# Patient Record
Sex: Female | Born: 1995 | Race: Black or African American | Hispanic: No | Marital: Single | Smoking: Never smoker
Health system: Southern US, Community
[De-identification: ages and names within clinical notes are randomized; demographics above are authoritative.]

## PROBLEM LIST (undated history)

## (undated) DIAGNOSIS — I7300 Raynaud's syndrome without gangrene: Secondary | ICD-10-CM

## (undated) DIAGNOSIS — F419 Anxiety disorder, unspecified: Secondary | ICD-10-CM

---
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
## 2014-11-12 ENCOUNTER — Emergency Department
Admit: 2014-11-12 | Disposition: A | Discharge status: Home / Self Care | Payer: Self-pay | Admitting: Emergency Medicine

## 2014-11-13 LAB — CBC WITH DIFFERENTIAL/PLATELET
BASOS PCT: 1.1 %
Basophil #: 0 10*3/uL (ref 0.0–0.1)
EOS ABS: 0 10*3/uL (ref 0.0–0.7)
Eosinophil %: 1.2 %
HCT: 46.5 % (ref 35.0–47.0)
HGB: 15.1 g/dL (ref 12.0–16.0)
LYMPHS PCT: 29.5 %
Lymphocyte #: 0.9 10*3/uL — ABNORMAL LOW (ref 1.0–3.6)
MCH: 28.8 pg (ref 26.0–34.0)
MCHC: 32.5 g/dL (ref 32.0–36.0)
MCV: 89 fL (ref 80–100)
Monocyte #: 0.5 x10 3/mm (ref 0.2–0.9)
Monocyte %: 15.4 %
NEUTROS PCT: 52.8 %
Neutrophil #: 1.6 10*3/uL (ref 1.4–6.5)
PLATELETS: 213 10*3/uL (ref 150–440)
RBC: 5.23 10*6/uL — ABNORMAL HIGH (ref 3.80–5.20)
RDW: 13.8 % (ref 11.5–14.5)
WBC: 3 10*3/uL — ABNORMAL LOW (ref 3.6–11.0)

## 2014-11-13 LAB — COMPREHENSIVE METABOLIC PANEL
ALT: 20 U/L
ANION GAP: 8 (ref 7–16)
Albumin: 4.1 g/dL
Alkaline Phosphatase: 61 U/L
BUN: 6 mg/dL
Bilirubin,Total: <0.1 mg/dL — ABNORMAL LOW
CHLORIDE: 103 mmol/L
CREATININE: 0.8 mg/dL
Calcium, Total: 9.1 mg/dL
Co2: 28 mmol/L
EGFR (African American): >60
EGFR (Non-African Amer.): >60
GLUCOSE: 101 mg/dL — AB
POTASSIUM: 3.5 mmol/L
SGOT(AST): 30 U/L
SODIUM: 139 mmol/L
TOTAL PROTEIN: 7.6 g/dL

## 2014-11-13 LAB — MONONUCLEOSIS SCREEN: MONO TEST: NEGATIVE

## 2014-11-15 LAB — BETA STREP CULTURE(ARMC)

## 2018-03-20 DIAGNOSIS — Z124 Encounter for screening for malignant neoplasm of cervix: Secondary | ICD-10-CM | POA: Diagnosis not present

## 2019-02-01 ENCOUNTER — Emergency Department (HOSPITAL_BASED_OUTPATIENT_CLINIC_OR_DEPARTMENT_OTHER)
Admission: EM | Admit: 2019-02-01 | Discharge: 2019-02-01 | Disposition: A | Discharge status: Home / Self Care | Payer: Managed Care, Other (non HMO) | Attending: Emergency Medicine | Admitting: Emergency Medicine

## 2019-02-01 ENCOUNTER — Encounter (HOSPITAL_BASED_OUTPATIENT_CLINIC_OR_DEPARTMENT_OTHER): Payer: Self-pay | Admitting: *Deleted

## 2019-02-01 ENCOUNTER — Emergency Department (HOSPITAL_BASED_OUTPATIENT_CLINIC_OR_DEPARTMENT_OTHER): Payer: Managed Care, Other (non HMO)

## 2019-02-01 ENCOUNTER — Other Ambulatory Visit: Payer: Self-pay

## 2019-02-01 DIAGNOSIS — R0789 Other chest pain: Secondary | ICD-10-CM | POA: Diagnosis present

## 2019-02-01 DIAGNOSIS — K219 Gastro-esophageal reflux disease without esophagitis: Secondary | ICD-10-CM | POA: Insufficient documentation

## 2019-02-01 DIAGNOSIS — R112 Nausea with vomiting, unspecified: Secondary | ICD-10-CM

## 2019-02-01 DIAGNOSIS — R079 Chest pain, unspecified: Secondary | ICD-10-CM

## 2019-02-01 HISTORY — DX: Raynaud's syndrome without gangrene: I73.00

## 2019-02-01 HISTORY — DX: Anxiety disorder, unspecified: F41.9

## 2019-02-01 LAB — CBC WITH DIFFERENTIAL/PLATELET
Abs Immature Granulocytes: 0 10*3/uL (ref 0.00–0.07)
Basophils Absolute: 0 10*3/uL (ref 0.0–0.1)
Basophils Relative: 1 %
Eosinophils Absolute: 0 10*3/uL (ref 0.0–0.5)
Eosinophils Relative: 1 %
HCT: 44.5 % (ref 36.0–46.0)
Hemoglobin: 14.8 g/dL (ref 12.0–15.0)
Immature Granulocytes: 0 %
Lymphocytes Relative: 31 %
Lymphs Abs: 1.1 10*3/uL (ref 0.7–4.0)
MCH: 29.6 pg (ref 26.0–34.0)
MCHC: 33.3 g/dL (ref 30.0–36.0)
MCV: 89 fL (ref 80.0–100.0)
Monocytes Absolute: 0.4 10*3/uL (ref 0.1–1.0)
Monocytes Relative: 12 %
Neutro Abs: 2 10*3/uL (ref 1.7–7.7)
Neutrophils Relative %: 55 %
Platelets: 277 10*3/uL (ref 150–400)
RBC: 5 MIL/uL (ref 3.87–5.11)
RDW: 12.3 % (ref 11.5–15.5)
WBC: 3.6 10*3/uL — ABNORMAL LOW (ref 4.0–10.5)
nRBC: 0 % (ref 0.0–0.2)

## 2019-02-01 LAB — COMPREHENSIVE METABOLIC PANEL
ALT: 14 U/L (ref 0–44)
AST: 19 U/L (ref 15–41)
Albumin: 4.6 g/dL (ref 3.5–5.0)
Alkaline Phosphatase: 49 U/L (ref 38–126)
Anion gap: 12 (ref 5–15)
BUN: 8 mg/dL (ref 6–20)
CO2: 24 mmol/L (ref 22–32)
Calcium: 9.1 mg/dL (ref 8.9–10.3)
Chloride: 103 mmol/L (ref 98–111)
Creatinine, Ser: 0.88 mg/dL (ref 0.44–1.00)
GFR calc Af Amer: >60 mL/min (ref >60)
GFR calc non Af Amer: >60 mL/min (ref >60)
Glucose, Bld: 77 mg/dL (ref 70–99)
Potassium: 3.4 mmol/L — ABNORMAL LOW (ref 3.5–5.1)
Sodium: 139 mmol/L (ref 135–145)
Total Bilirubin: 0.5 mg/dL (ref 0.3–1.2)
Total Protein: 7.7 g/dL (ref 6.5–8.1)

## 2019-02-01 LAB — TROPONIN I (HIGH SENSITIVITY): Troponin I (High Sensitivity): <2 ng/L (ref <18)

## 2019-02-01 LAB — PREGNANCY, URINE: Preg Test, Ur: NEGATIVE

## 2019-02-01 LAB — LIPASE, BLOOD: Lipase: 36 U/L (ref 11–51)

## 2019-02-01 MED ORDER — FAMOTIDINE 20 MG PO TABS: 20.0000 mg | ORAL_TABLET | Freq: Two times a day (BID) | ORAL | 0 refills | Status: AC

## 2019-02-01 MED ORDER — POTASSIUM CHLORIDE CRYS ER 20 MEQ PO TBCR
20.0000 meq | EXTENDED_RELEASE_TABLET | Freq: Once | ORAL | Status: AC
  Administered 2019-02-01: 20 meq via ORAL
  Filled 2019-02-01: qty 1

## 2019-02-01 MED ORDER — SODIUM CHLORIDE 0.9 % IV BOLUS
1000.0000 mL | Freq: Once | INTRAVENOUS | Status: AC
  Administered 2019-02-01: 1000 mL via INTRAVENOUS

## 2019-02-01 NOTE — ED Notes (Signed)
ED Provider at bedside. 

## 2019-02-01 NOTE — ED Provider Notes (Signed)
[facility] EMERGENCY DEPARTMENT Provider Note   CSN: [id number] Arrival date & time: 02/01/19  1843    History   Chief Complaint No chief complaint on file.   HPI Kelsey Davidson is a 23 y.o. female.     HPI   23 year old female presents with concern for twinge in her chest, bilateral numbness in her hands after being started on omeprazole after being seen at an urgent care for abdominal pain, nausea and vomiting, being diagnosed with GERD.  Patient reports that she has had abdominal symptoms for the last few months.  Reports that over the last week, her symptoms are worse and she presented to urgent care.  Reports she has had episodes of vomiting daily, feeling of nausea, as well as sensation that she has something in the back of her throat.  Describes it as a "paper feeling" reports it feels like something is there when she swallows.  She does report some abdominal pain after eating.  Also reports that every time she eats she feels she has to go to the bathroom.  Reports on Wednesday, she began to feel like a pressure under her left collarbone, as well as pressure in her hands. The chest pain feels like a twinge.  Denies shortness of breath.  She does take birth control, and recently drove from [place], denies any leg pain or swelling or dyspnea.  Denies family history of DVT or PE, early heart disease.  Denies history of smoking cigarettes or using drugs.  Past Medical History:  Diagnosis Date  . Anxiety   . Raynaud's disease     There are no active problems to display for this patient.   History reviewed. No pertinent surgical history.   OB History   No obstetric history on file.      Home Medications    Prior to Admission medications   Medication Sig Start Date End Date Taking? Authorizing Provider  OMEPRAZOLE PO Take by mouth.  02/01/19 Yes [provider]  famotidine (PEPCID) 20 MG tablet Take 1 tablet (20 mg total) by mouth 2 (two) times  daily. 02/01/19   Gareth Morgan, MD    Family History No family history on file.  Social History Social History   Tobacco Use  . Smoking status: Never Smoker  . Smokeless tobacco: Never Used  Substance Use Topics  . Alcohol use: Never    Frequency: Never  . Drug use: Never     Allergies   Patient has no known allergies.   Review of Systems Review of Systems  Constitutional: Negative for fever.  HENT: Negative for sore throat. Trouble swallowing: sensation of something i n her throat.   Eyes: Negative for visual disturbance.  Respiratory: Negative for cough and shortness of breath.   Cardiovascular: Positive for chest pain.  Gastrointestinal: Positive for abdominal pain, nausea and vomiting. Diarrhea: denies but feels need to go after eating.  Genitourinary: Negative for difficulty urinating and dysuria.  Musculoskeletal: Negative for back pain and neck pain.  Skin: Negative for rash.  Neurological: Negative for syncope and headaches.     Physical Exam Updated Vital Signs BP 118/78 (BP Location: Left Arm)   Pulse 70   Temp 98.7 F (37.1 C) (Oral)   Resp 16   Ht 5\' 3"  (1.6 m)   Wt 47.3 kg   SpO2 100%   BMI 18.48 kg/m   Physical Exam Vitals signs and nursing note reviewed.  Constitutional:      General: She  is not in acute distress.    Appearance: She is well-developed. She is not diaphoretic.  HENT:     Head: Normocephalic and atraumatic.  Eyes:     Conjunctiva/sclera: Conjunctivae normal.  Neck:     Musculoskeletal: Normal range of motion.  Cardiovascular:     Rate and Rhythm: Normal rate and regular rhythm.     Heart sounds: Normal heart sounds. No murmur. No friction rub. No gallop.   Pulmonary:     Effort: Pulmonary effort is normal. No respiratory distress.     Breath sounds: Normal breath sounds. No wheezing or rales.  Abdominal:     General: There is no distension.     Palpations: Abdomen is soft.     Tenderness: There is abdominal  tenderness (epigastrium). There is no guarding.  Musculoskeletal:        General: No tenderness.  Skin:    General: Skin is warm and dry.     Findings: No erythema or rash.  Neurological:     Mental Status: She is alert and oriented to person, place, and time.      ED Treatments / Results  Labs (all labs ordered are listed, but only abnormal results are displayed) Labs Reviewed  CBC WITH DIFFERENTIAL/PLATELET - Abnormal; Notable for the following components:      Result Value   WBC 3.6 (*)    All other components within normal limits  COMPREHENSIVE METABOLIC PANEL - Abnormal; Notable for the following components:   Potassium 3.4 (*)    All other components within normal limits  LIPASE, BLOOD  TROPONIN I (HIGH SENSITIVITY)  PREGNANCY, URINE  TROPONIN I (HIGH SENSITIVITY)    EKG EKG Interpretation  Date/Time:  Friday February 01 2019 19:01:34 EDT Ventricular Rate:  112 PR Interval:    QRS Duration: 84 QT Interval:  326 QTC Calculation: 445 R Axis:   41 Text Interpretation:  Sinus tachycardia No previous ECGs available Confirmed by Alvira MondaySchlossman, Delonte Musich ([id number]) on 02/01/2019 7:58:26 PM   Radiology Dg Chest 2 View  Result Date: 02/01/2019 CLINICAL DATA:  23 year old female with chest pain. EXAM: CHEST - 2 VIEW COMPARISON:  None. FINDINGS: The heart size and mediastinal contours are within normal limits. Both lungs are clear. The visualized skeletal structures are unremarkable. IMPRESSION: No active cardiopulmonary disease. Electronically Signed   By: Elgie CollardArash  Radparvar M.D.   On: 02/01/2019 20:01    Procedures Procedures (including critical care time)  Medications Ordered in ED Medications  sodium chloride 0.9 % bolus 1,000 mL (0 mLs Intravenous Stopped 02/01/19 2103)  potassium chloride SA (K-DUR) CR tablet 20 mEq (20 mEq Oral Given 02/01/19 2145)     Initial Impression / Assessment and Plan / ED Course  I have reviewed the triage vital signs and the nursing notes.   Pertinent labs & imaging results that were available during my care of the patient were reviewed by me and considered in my medical decision making (see chart for details).         23 year old female presents with concern for twinge in her chest, bilateral numbness in her hands after being started on omeprazole after being seen at an urgent care for abdominal pain, nausea and vomiting, being diagnosed with GERD.  Patient with tachycardia on arrival, but this is in the setting of feeling anxious, and dehydration in setting of not eating or drinking as much.  On recheck, patient's heart rate improved.  Describing chest symptoms after starting omeprazole.  EKG was completed and evaluate  me and shows no acute abnormalities.  Chest x-ray shows no sign of pneumonia, pneumothorax, or other abnormalities.  Regarding her chest pain, troponin negative, EKG without acute abnormalities.  Have low suspicion for pulmonary embolus given normal oxygenation, no asymmetric leg swelling, no shortness of breath.  Pregnancy test is negative.  Regarding her abdominal pain, there is no sign of pancreatitis, hepatitis, low suspicion for cholecystitis by history and physical exam.  Recommend continued outpatient follow-up for her abdominal pain, but agree that gastroesophageal reflux as diagnosed at the urgent care is possible.  Given her symptoms developed after starting omeprazole, recommend discontinuing this medication, will give prescription for famotidine.  Patient is scheduled to see a primary care physician for the first time.  Encouraged this follow-up, and discussed that if her symptoms continue, she may also require gastroenterology follow-up. Patient discharged in stable condition with understanding of reasons to return.   Final Clinical Impressions(s) / ED Diagnoses   Final diagnoses:  Chest pain, unspecified type  Nausea and vomiting, intractability of vomiting not specified, unspecified vomiting type   Gastroesophageal reflux disease, esophagitis presence not specified    ED Discharge Orders         Ordered    famotidine (PEPCID) 20 MG tablet  2 times daily     02/01/19 2133           Alvira MondaySchlossman, Murlene Revell, MD 02/02/19 0040

## 2019-02-01 NOTE — ED Notes (Signed)
Patient transported to X-ray 

## 2019-02-01 NOTE — ED Triage Notes (Signed)
Abdominal issues for a while. Nausea. She was seen at [facility] and diagnosed with GERD. She was told to take Omeprazole. Now she has numbness in her hands and "twinging" in her chest.

## 2019-02-01 NOTE — ED Notes (Signed)
Pt. Reports she has been vomiting a lot lately and had a paper feeling in her mouth and throat.  Pt. Said she was put on Omeprazole for GERD but now she feels she ahd had some reaction to.  Pt. Said she feels like she has pressure under her collar bone and pressure in her hands.   Pt. Is extemely nervous shaking with heart rate up.

## 2019-02-07 ENCOUNTER — Other Ambulatory Visit (HOSPITAL_COMMUNITY): Payer: Self-pay | Admitting: Gastroenterology

## 2019-02-07 DIAGNOSIS — R1011 Right upper quadrant pain: Secondary | ICD-10-CM

## 2019-02-13 ENCOUNTER — Other Ambulatory Visit: Payer: Self-pay | Admitting: Gastroenterology

## 2019-02-13 DIAGNOSIS — R1310 Dysphagia, unspecified: Secondary | ICD-10-CM

## 2019-02-15 ENCOUNTER — Ambulatory Visit
Admission: RE | Admit: 2019-02-15 | Discharge: 2019-02-15 | Disposition: A | Discharge status: Home / Self Care | Payer: Managed Care, Other (non HMO) | Source: Ambulatory Visit | Attending: Gastroenterology | Admitting: Gastroenterology

## 2019-02-15 DIAGNOSIS — R1310 Dysphagia, unspecified: Secondary | ICD-10-CM

## 2019-02-19 ENCOUNTER — Encounter (HOSPITAL_COMMUNITY)
Admission: RE | Admit: 2019-02-19 | Discharge: 2019-02-19 | Disposition: A | Discharge status: Home / Self Care | Payer: Managed Care, Other (non HMO) | Source: Ambulatory Visit | Attending: Gastroenterology | Admitting: Gastroenterology

## 2019-02-19 ENCOUNTER — Ambulatory Visit (HOSPITAL_COMMUNITY)
Admission: RE | Admit: 2019-02-19 | Discharge: 2019-02-19 | Disposition: A | Discharge status: Home / Self Care | Payer: Managed Care, Other (non HMO) | Source: Ambulatory Visit | Attending: Gastroenterology | Admitting: Gastroenterology

## 2019-02-19 DIAGNOSIS — R1011 Right upper quadrant pain: Secondary | ICD-10-CM | POA: Diagnosis present

## 2019-02-19 MED ORDER — TECHNETIUM TC 99M MEBROFENIN IV KIT
5.2000 | PACK | Freq: Once | INTRAVENOUS | Status: AC | PRN
  Administered 2019-02-19: 5.2 via INTRAVENOUS

## 2019-05-16 ENCOUNTER — Ambulatory Visit (INDEPENDENT_AMBULATORY_CARE_PROVIDER_SITE_OTHER): Payer: 59 | Admitting: Psychology

## 2019-05-16 DIAGNOSIS — F410 Panic disorder [episodic paroxysmal anxiety] without agoraphobia: Secondary | ICD-10-CM | POA: Diagnosis not present

## 2019-05-30 ENCOUNTER — Ambulatory Visit (INDEPENDENT_AMBULATORY_CARE_PROVIDER_SITE_OTHER): Payer: 59 | Admitting: Psychology

## 2019-05-30 DIAGNOSIS — F410 Panic disorder [episodic paroxysmal anxiety] without agoraphobia: Secondary | ICD-10-CM

## 2019-06-12 ENCOUNTER — Other Ambulatory Visit: Payer: Self-pay | Admitting: Otolaryngology

## 2019-06-12 DIAGNOSIS — R070 Pain in throat: Secondary | ICD-10-CM

## 2019-06-18 ENCOUNTER — Ambulatory Visit (INDEPENDENT_AMBULATORY_CARE_PROVIDER_SITE_OTHER): Payer: 59 | Admitting: Psychology

## 2019-06-18 DIAGNOSIS — F410 Panic disorder [episodic paroxysmal anxiety] without agoraphobia: Secondary | ICD-10-CM | POA: Diagnosis not present

## 2019-06-19 ENCOUNTER — Ambulatory Visit
Admission: RE | Admit: 2019-06-19 | Discharge: 2019-06-19 | Disposition: A | Discharge status: Home / Self Care | Payer: Managed Care, Other (non HMO) | Source: Ambulatory Visit | Attending: Otolaryngology | Admitting: Otolaryngology

## 2019-06-19 ENCOUNTER — Other Ambulatory Visit: Payer: Self-pay

## 2019-06-19 DIAGNOSIS — R070 Pain in throat: Secondary | ICD-10-CM

## 2019-06-19 MED ORDER — IOPAMIDOL (ISOVUE-300) INJECTION 61%
75.0000 mL | Freq: Once | INTRAVENOUS | Status: AC | PRN
  Administered 2019-06-19: 75 mL via INTRAVENOUS

## 2019-07-02 ENCOUNTER — Ambulatory Visit (INDEPENDENT_AMBULATORY_CARE_PROVIDER_SITE_OTHER): Payer: 59 | Admitting: Psychology

## 2019-07-02 DIAGNOSIS — F410 Panic disorder [episodic paroxysmal anxiety] without agoraphobia: Secondary | ICD-10-CM | POA: Diagnosis not present

## 2019-07-17 ENCOUNTER — Ambulatory Visit: Payer: 59 | Admitting: Psychology

## 2019-08-21 DIAGNOSIS — M542 Cervicalgia: Secondary | ICD-10-CM | POA: Diagnosis not present

## 2019-09-02 ENCOUNTER — Ambulatory Visit (INDEPENDENT_AMBULATORY_CARE_PROVIDER_SITE_OTHER): Payer: BC Managed Care – PPO | Admitting: Psychology

## 2019-09-02 DIAGNOSIS — F410 Panic disorder [episodic paroxysmal anxiety] without agoraphobia: Secondary | ICD-10-CM | POA: Diagnosis not present

## 2019-09-16 ENCOUNTER — Ambulatory Visit (INDEPENDENT_AMBULATORY_CARE_PROVIDER_SITE_OTHER): Payer: BC Managed Care – PPO | Admitting: Psychology

## 2019-09-16 DIAGNOSIS — F410 Panic disorder [episodic paroxysmal anxiety] without agoraphobia: Secondary | ICD-10-CM

## 2019-09-18 DIAGNOSIS — M542 Cervicalgia: Secondary | ICD-10-CM | POA: Diagnosis not present

## 2019-09-30 ENCOUNTER — Ambulatory Visit: Payer: BC Managed Care – PPO | Admitting: Psychology

## 2019-10-18 DIAGNOSIS — M542 Cervicalgia: Secondary | ICD-10-CM | POA: Diagnosis not present

## 2019-10-22 ENCOUNTER — Ambulatory Visit (INDEPENDENT_AMBULATORY_CARE_PROVIDER_SITE_OTHER): Payer: BC Managed Care – PPO | Admitting: Psychology

## 2019-10-22 DIAGNOSIS — F410 Panic disorder [episodic paroxysmal anxiety] without agoraphobia: Secondary | ICD-10-CM | POA: Diagnosis not present

## 2019-10-23 DIAGNOSIS — Z79899 Other long term (current) drug therapy: Secondary | ICD-10-CM | POA: Diagnosis not present

## 2019-10-23 DIAGNOSIS — R7989 Other specified abnormal findings of blood chemistry: Secondary | ICD-10-CM | POA: Diagnosis not present

## 2019-10-23 DIAGNOSIS — Z0000 Encounter for general adult medical examination without abnormal findings: Secondary | ICD-10-CM | POA: Diagnosis not present

## 2019-10-29 DIAGNOSIS — Z0000 Encounter for general adult medical examination without abnormal findings: Secondary | ICD-10-CM | POA: Diagnosis not present

## 2019-10-29 DIAGNOSIS — Z1331 Encounter for screening for depression: Secondary | ICD-10-CM | POA: Diagnosis not present

## 2019-10-29 DIAGNOSIS — J309 Allergic rhinitis, unspecified: Secondary | ICD-10-CM | POA: Diagnosis not present

## 2019-10-29 DIAGNOSIS — R82998 Other abnormal findings in urine: Secondary | ICD-10-CM | POA: Diagnosis not present

## 2019-10-29 DIAGNOSIS — M62838 Other muscle spasm: Secondary | ICD-10-CM | POA: Diagnosis not present

## 2019-10-29 DIAGNOSIS — K589 Irritable bowel syndrome without diarrhea: Secondary | ICD-10-CM | POA: Diagnosis not present

## 2019-10-29 DIAGNOSIS — F39 Unspecified mood [affective] disorder: Secondary | ICD-10-CM | POA: Diagnosis not present

## 2019-10-31 DIAGNOSIS — M542 Cervicalgia: Secondary | ICD-10-CM | POA: Diagnosis not present

## 2019-11-04 ENCOUNTER — Ambulatory Visit (INDEPENDENT_AMBULATORY_CARE_PROVIDER_SITE_OTHER): Payer: BC Managed Care – PPO | Admitting: Psychology

## 2019-11-04 DIAGNOSIS — F410 Panic disorder [episodic paroxysmal anxiety] without agoraphobia: Secondary | ICD-10-CM | POA: Diagnosis not present

## 2019-11-13 DIAGNOSIS — M542 Cervicalgia: Secondary | ICD-10-CM | POA: Diagnosis not present

## 2019-11-19 ENCOUNTER — Ambulatory Visit (INDEPENDENT_AMBULATORY_CARE_PROVIDER_SITE_OTHER): Payer: BC Managed Care – PPO | Admitting: Psychology

## 2019-11-19 DIAGNOSIS — F410 Panic disorder [episodic paroxysmal anxiety] without agoraphobia: Secondary | ICD-10-CM

## 2019-11-27 DIAGNOSIS — M542 Cervicalgia: Secondary | ICD-10-CM | POA: Diagnosis not present

## 2019-12-02 ENCOUNTER — Ambulatory Visit (INDEPENDENT_AMBULATORY_CARE_PROVIDER_SITE_OTHER): Payer: BC Managed Care – PPO | Admitting: Psychology

## 2019-12-02 DIAGNOSIS — F410 Panic disorder [episodic paroxysmal anxiety] without agoraphobia: Secondary | ICD-10-CM | POA: Diagnosis not present

## 2019-12-11 DIAGNOSIS — M542 Cervicalgia: Secondary | ICD-10-CM | POA: Diagnosis not present

## 2019-12-23 ENCOUNTER — Ambulatory Visit (INDEPENDENT_AMBULATORY_CARE_PROVIDER_SITE_OTHER): Payer: BC Managed Care – PPO | Admitting: Psychology

## 2019-12-23 DIAGNOSIS — F410 Panic disorder [episodic paroxysmal anxiety] without agoraphobia: Secondary | ICD-10-CM | POA: Diagnosis not present

## 2020-01-17 ENCOUNTER — Ambulatory Visit (INDEPENDENT_AMBULATORY_CARE_PROVIDER_SITE_OTHER): Payer: BC Managed Care – PPO | Admitting: Psychology

## 2020-01-17 DIAGNOSIS — F410 Panic disorder [episodic paroxysmal anxiety] without agoraphobia: Secondary | ICD-10-CM

## 2020-02-03 ENCOUNTER — Ambulatory Visit: Payer: BC Managed Care – PPO | Admitting: Psychology

## 2020-02-11 DIAGNOSIS — M542 Cervicalgia: Secondary | ICD-10-CM | POA: Diagnosis not present

## 2020-02-19 ENCOUNTER — Ambulatory Visit (INDEPENDENT_AMBULATORY_CARE_PROVIDER_SITE_OTHER): Payer: BC Managed Care – PPO | Admitting: Psychology

## 2020-02-19 DIAGNOSIS — M542 Cervicalgia: Secondary | ICD-10-CM | POA: Diagnosis not present

## 2020-02-19 DIAGNOSIS — F410 Panic disorder [episodic paroxysmal anxiety] without agoraphobia: Secondary | ICD-10-CM

## 2020-03-03 ENCOUNTER — Ambulatory Visit (INDEPENDENT_AMBULATORY_CARE_PROVIDER_SITE_OTHER): Payer: BC Managed Care – PPO | Admitting: Psychology

## 2020-03-03 DIAGNOSIS — F410 Panic disorder [episodic paroxysmal anxiety] without agoraphobia: Secondary | ICD-10-CM

## 2020-03-10 DIAGNOSIS — M62838 Other muscle spasm: Secondary | ICD-10-CM | POA: Diagnosis not present

## 2020-03-10 DIAGNOSIS — M542 Cervicalgia: Secondary | ICD-10-CM | POA: Diagnosis not present

## 2020-03-12 DIAGNOSIS — M62830 Muscle spasm of back: Secondary | ICD-10-CM | POA: Diagnosis not present

## 2020-03-17 DIAGNOSIS — M9902 Segmental and somatic dysfunction of thoracic region: Secondary | ICD-10-CM | POA: Diagnosis not present

## 2020-03-17 DIAGNOSIS — M5382 Other specified dorsopathies, cervical region: Secondary | ICD-10-CM | POA: Diagnosis not present

## 2020-03-17 DIAGNOSIS — M9901 Segmental and somatic dysfunction of cervical region: Secondary | ICD-10-CM | POA: Diagnosis not present

## 2020-03-17 DIAGNOSIS — M5384 Other specified dorsopathies, thoracic region: Secondary | ICD-10-CM | POA: Diagnosis not present

## 2020-03-19 ENCOUNTER — Ambulatory Visit: Payer: BC Managed Care – PPO | Admitting: Psychology

## 2020-03-19 DIAGNOSIS — M9901 Segmental and somatic dysfunction of cervical region: Secondary | ICD-10-CM | POA: Diagnosis not present

## 2020-03-19 DIAGNOSIS — M5382 Other specified dorsopathies, cervical region: Secondary | ICD-10-CM | POA: Diagnosis not present

## 2020-03-19 DIAGNOSIS — M9902 Segmental and somatic dysfunction of thoracic region: Secondary | ICD-10-CM | POA: Diagnosis not present

## 2020-03-19 DIAGNOSIS — M5384 Other specified dorsopathies, thoracic region: Secondary | ICD-10-CM | POA: Diagnosis not present

## 2020-03-20 ENCOUNTER — Ambulatory Visit (INDEPENDENT_AMBULATORY_CARE_PROVIDER_SITE_OTHER): Payer: BC Managed Care – PPO | Admitting: Psychology

## 2020-03-20 DIAGNOSIS — F410 Panic disorder [episodic paroxysmal anxiety] without agoraphobia: Secondary | ICD-10-CM

## 2020-03-20 DIAGNOSIS — Z03818 Encounter for observation for suspected exposure to other biological agents ruled out: Secondary | ICD-10-CM | POA: Diagnosis not present

## 2020-03-23 DIAGNOSIS — M5384 Other specified dorsopathies, thoracic region: Secondary | ICD-10-CM | POA: Diagnosis not present

## 2020-03-23 DIAGNOSIS — M9902 Segmental and somatic dysfunction of thoracic region: Secondary | ICD-10-CM | POA: Diagnosis not present

## 2020-03-23 DIAGNOSIS — M5382 Other specified dorsopathies, cervical region: Secondary | ICD-10-CM | POA: Diagnosis not present

## 2020-03-23 DIAGNOSIS — M9901 Segmental and somatic dysfunction of cervical region: Secondary | ICD-10-CM | POA: Diagnosis not present

## 2020-03-26 DIAGNOSIS — M9902 Segmental and somatic dysfunction of thoracic region: Secondary | ICD-10-CM | POA: Diagnosis not present

## 2020-03-26 DIAGNOSIS — M5384 Other specified dorsopathies, thoracic region: Secondary | ICD-10-CM | POA: Diagnosis not present

## 2020-03-26 DIAGNOSIS — M9901 Segmental and somatic dysfunction of cervical region: Secondary | ICD-10-CM | POA: Diagnosis not present

## 2020-03-26 DIAGNOSIS — M5382 Other specified dorsopathies, cervical region: Secondary | ICD-10-CM | POA: Diagnosis not present

## 2020-03-30 DIAGNOSIS — M5384 Other specified dorsopathies, thoracic region: Secondary | ICD-10-CM | POA: Diagnosis not present

## 2020-03-30 DIAGNOSIS — M5032 Other cervical disc degeneration, mid-cervical region, unspecified level: Secondary | ICD-10-CM | POA: Diagnosis not present

## 2020-03-30 DIAGNOSIS — M9901 Segmental and somatic dysfunction of cervical region: Secondary | ICD-10-CM | POA: Diagnosis not present

## 2020-03-30 DIAGNOSIS — M5382 Other specified dorsopathies, cervical region: Secondary | ICD-10-CM | POA: Diagnosis not present

## 2020-03-30 DIAGNOSIS — M9902 Segmental and somatic dysfunction of thoracic region: Secondary | ICD-10-CM | POA: Diagnosis not present

## 2020-03-31 DIAGNOSIS — M5382 Other specified dorsopathies, cervical region: Secondary | ICD-10-CM | POA: Diagnosis not present

## 2020-03-31 DIAGNOSIS — M9901 Segmental and somatic dysfunction of cervical region: Secondary | ICD-10-CM | POA: Diagnosis not present

## 2020-03-31 DIAGNOSIS — M5384 Other specified dorsopathies, thoracic region: Secondary | ICD-10-CM | POA: Diagnosis not present

## 2020-03-31 DIAGNOSIS — M9902 Segmental and somatic dysfunction of thoracic region: Secondary | ICD-10-CM | POA: Diagnosis not present

## 2020-04-02 DIAGNOSIS — M5382 Other specified dorsopathies, cervical region: Secondary | ICD-10-CM | POA: Diagnosis not present

## 2020-04-02 DIAGNOSIS — M9901 Segmental and somatic dysfunction of cervical region: Secondary | ICD-10-CM | POA: Diagnosis not present

## 2020-04-02 DIAGNOSIS — M9902 Segmental and somatic dysfunction of thoracic region: Secondary | ICD-10-CM | POA: Diagnosis not present

## 2020-04-02 DIAGNOSIS — M5384 Other specified dorsopathies, thoracic region: Secondary | ICD-10-CM | POA: Diagnosis not present

## 2020-04-06 DIAGNOSIS — M9901 Segmental and somatic dysfunction of cervical region: Secondary | ICD-10-CM | POA: Diagnosis not present

## 2020-04-06 DIAGNOSIS — M5386 Other specified dorsopathies, lumbar region: Secondary | ICD-10-CM | POA: Diagnosis not present

## 2020-04-06 DIAGNOSIS — M5382 Other specified dorsopathies, cervical region: Secondary | ICD-10-CM | POA: Diagnosis not present

## 2020-04-06 DIAGNOSIS — M9903 Segmental and somatic dysfunction of lumbar region: Secondary | ICD-10-CM | POA: Diagnosis not present

## 2020-04-09 DIAGNOSIS — M9903 Segmental and somatic dysfunction of lumbar region: Secondary | ICD-10-CM | POA: Diagnosis not present

## 2020-04-09 DIAGNOSIS — M5382 Other specified dorsopathies, cervical region: Secondary | ICD-10-CM | POA: Diagnosis not present

## 2020-04-09 DIAGNOSIS — M5386 Other specified dorsopathies, lumbar region: Secondary | ICD-10-CM | POA: Diagnosis not present

## 2020-04-09 DIAGNOSIS — M9901 Segmental and somatic dysfunction of cervical region: Secondary | ICD-10-CM | POA: Diagnosis not present

## 2020-04-10 ENCOUNTER — Ambulatory Visit (INDEPENDENT_AMBULATORY_CARE_PROVIDER_SITE_OTHER): Payer: BC Managed Care – PPO | Admitting: Psychology

## 2020-04-10 DIAGNOSIS — F410 Panic disorder [episodic paroxysmal anxiety] without agoraphobia: Secondary | ICD-10-CM

## 2020-04-14 DIAGNOSIS — M5386 Other specified dorsopathies, lumbar region: Secondary | ICD-10-CM | POA: Diagnosis not present

## 2020-04-14 DIAGNOSIS — M5382 Other specified dorsopathies, cervical region: Secondary | ICD-10-CM | POA: Diagnosis not present

## 2020-04-14 DIAGNOSIS — M9901 Segmental and somatic dysfunction of cervical region: Secondary | ICD-10-CM | POA: Diagnosis not present

## 2020-04-14 DIAGNOSIS — M9903 Segmental and somatic dysfunction of lumbar region: Secondary | ICD-10-CM | POA: Diagnosis not present

## 2020-04-21 ENCOUNTER — Ambulatory Visit (INDEPENDENT_AMBULATORY_CARE_PROVIDER_SITE_OTHER): Payer: BC Managed Care – PPO | Admitting: Psychology

## 2020-04-21 DIAGNOSIS — F410 Panic disorder [episodic paroxysmal anxiety] without agoraphobia: Secondary | ICD-10-CM

## 2020-04-21 DIAGNOSIS — M9903 Segmental and somatic dysfunction of lumbar region: Secondary | ICD-10-CM | POA: Diagnosis not present

## 2020-04-21 DIAGNOSIS — M5386 Other specified dorsopathies, lumbar region: Secondary | ICD-10-CM | POA: Diagnosis not present

## 2020-04-21 DIAGNOSIS — M5382 Other specified dorsopathies, cervical region: Secondary | ICD-10-CM | POA: Diagnosis not present

## 2020-04-21 DIAGNOSIS — M9901 Segmental and somatic dysfunction of cervical region: Secondary | ICD-10-CM | POA: Diagnosis not present

## 2020-04-28 ENCOUNTER — Ambulatory Visit (INDEPENDENT_AMBULATORY_CARE_PROVIDER_SITE_OTHER): Payer: BC Managed Care – PPO | Admitting: Psychology

## 2020-04-28 DIAGNOSIS — F410 Panic disorder [episodic paroxysmal anxiety] without agoraphobia: Secondary | ICD-10-CM

## 2020-04-28 DIAGNOSIS — M5382 Other specified dorsopathies, cervical region: Secondary | ICD-10-CM | POA: Diagnosis not present

## 2020-04-28 DIAGNOSIS — M5386 Other specified dorsopathies, lumbar region: Secondary | ICD-10-CM | POA: Diagnosis not present

## 2020-04-28 DIAGNOSIS — M9901 Segmental and somatic dysfunction of cervical region: Secondary | ICD-10-CM | POA: Diagnosis not present

## 2020-04-28 DIAGNOSIS — M9903 Segmental and somatic dysfunction of lumbar region: Secondary | ICD-10-CM | POA: Diagnosis not present

## 2020-04-30 DIAGNOSIS — M5481 Occipital neuralgia: Secondary | ICD-10-CM | POA: Diagnosis not present

## 2020-05-05 ENCOUNTER — Ambulatory Visit (INDEPENDENT_AMBULATORY_CARE_PROVIDER_SITE_OTHER): Payer: BC Managed Care – PPO | Admitting: Psychology

## 2020-05-05 DIAGNOSIS — F410 Panic disorder [episodic paroxysmal anxiety] without agoraphobia: Secondary | ICD-10-CM | POA: Diagnosis not present

## 2020-05-20 ENCOUNTER — Ambulatory Visit (INDEPENDENT_AMBULATORY_CARE_PROVIDER_SITE_OTHER): Payer: BC Managed Care – PPO | Admitting: Psychology

## 2020-05-20 DIAGNOSIS — F410 Panic disorder [episodic paroxysmal anxiety] without agoraphobia: Secondary | ICD-10-CM

## 2020-05-21 DIAGNOSIS — Z01419 Encounter for gynecological examination (general) (routine) without abnormal findings: Secondary | ICD-10-CM | POA: Diagnosis not present

## 2020-05-21 DIAGNOSIS — Z681 Body mass index (BMI) 19 or less, adult: Secondary | ICD-10-CM | POA: Diagnosis not present

## 2020-05-21 DIAGNOSIS — N760 Acute vaginitis: Secondary | ICD-10-CM | POA: Diagnosis not present

## 2020-06-02 IMAGING — CR CHEST - 2 VIEW
2 series · 2 of 2 positions shown · non-contrast
Comparison: None.

CLINICAL DATA: 22-year-old female with chest pain.

EXAM:
CHEST - 2 VIEW

[w chest pa]
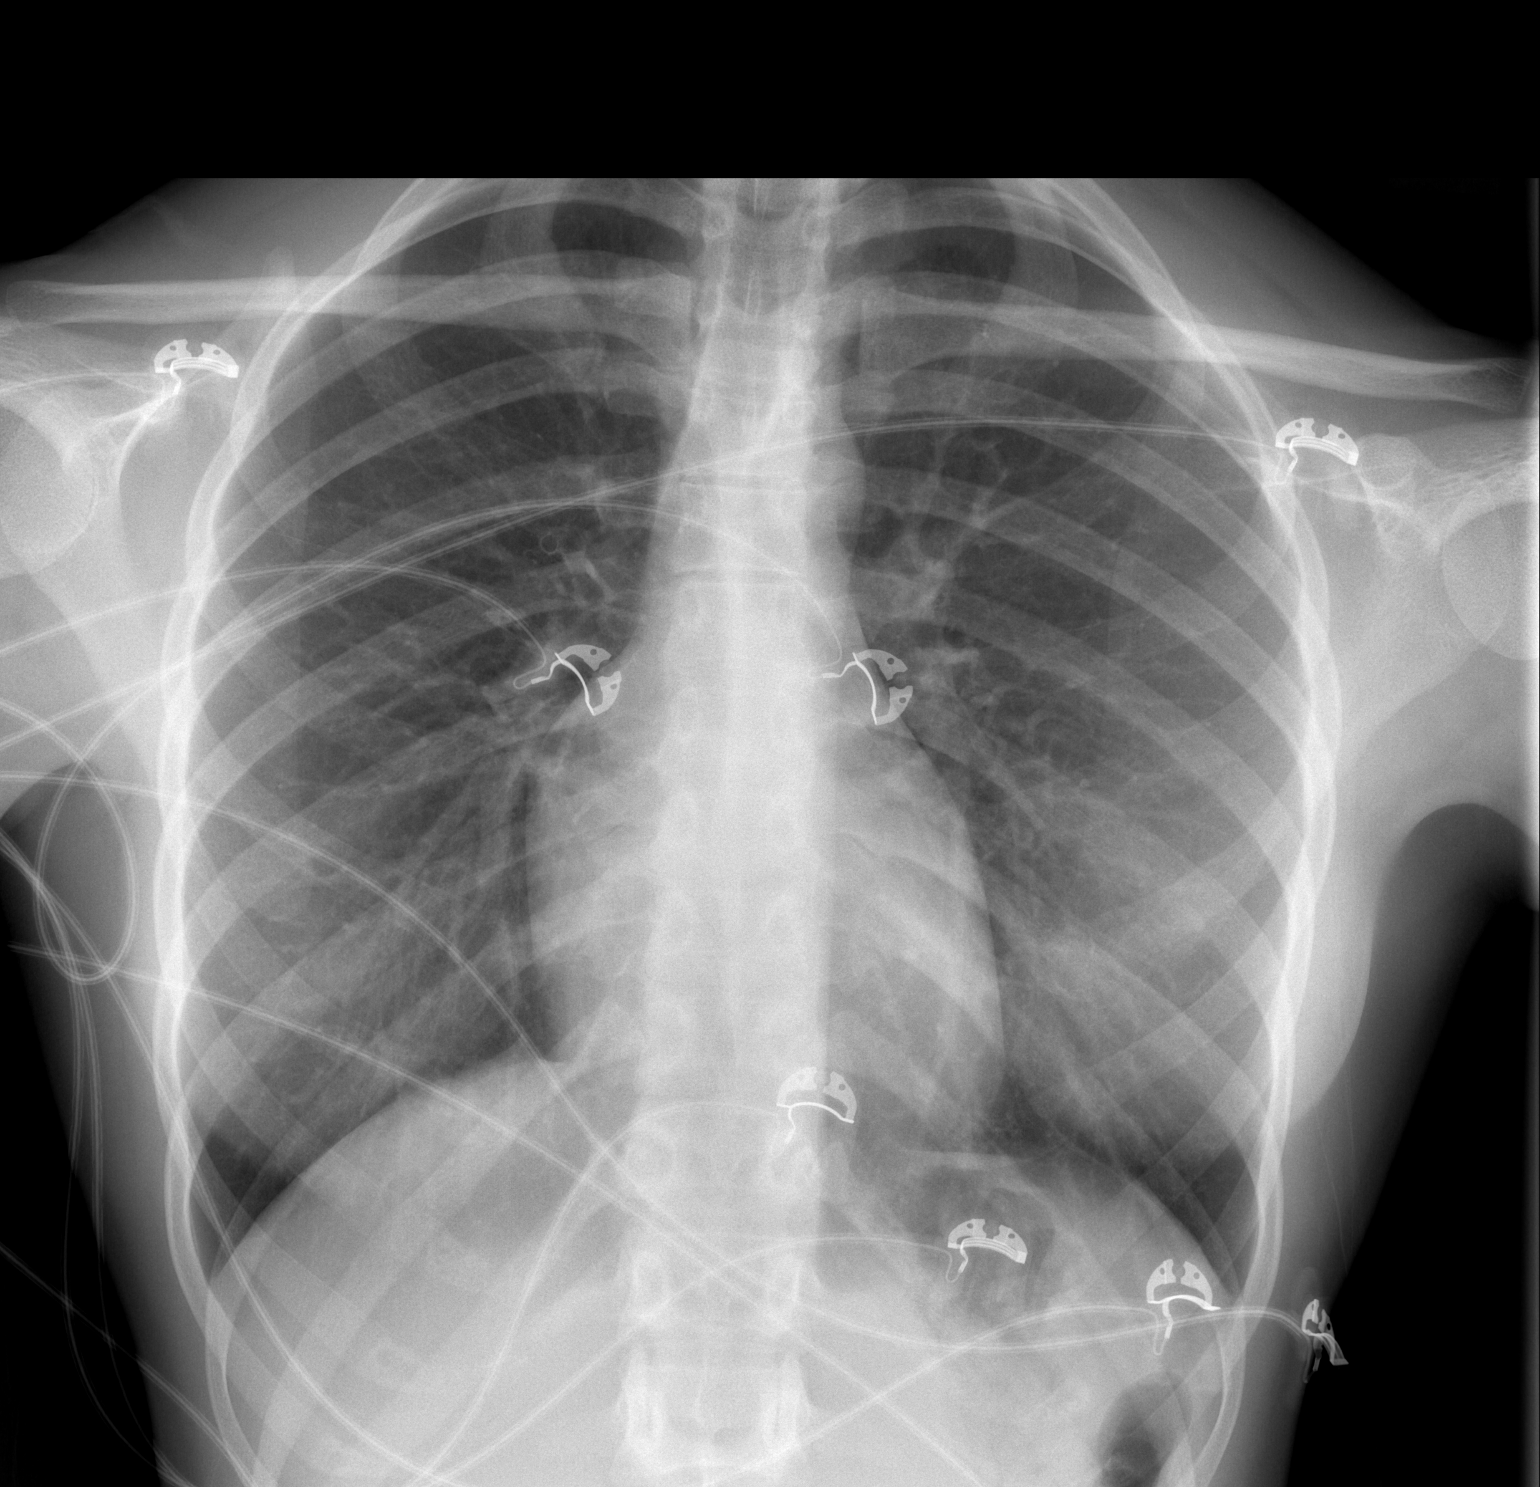

[w chest lat]
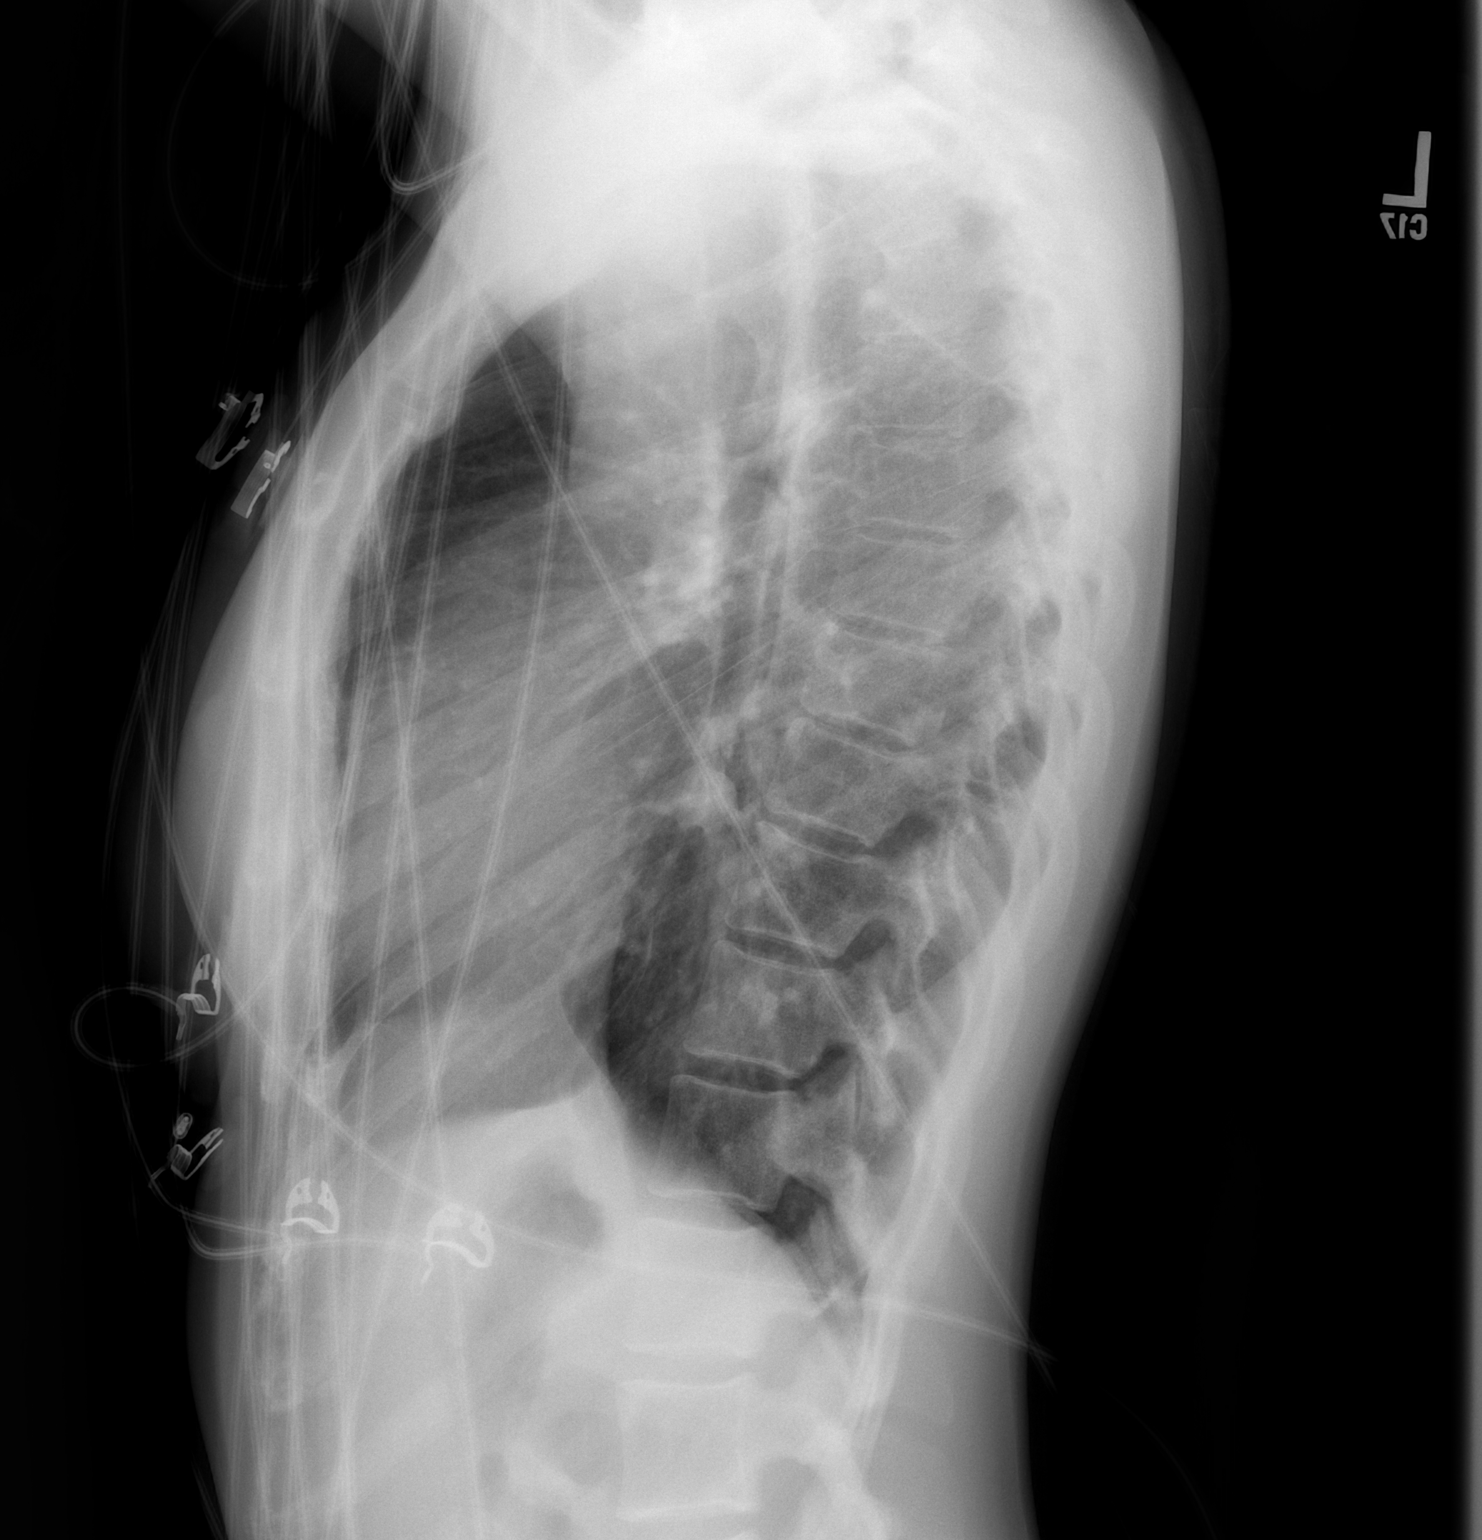

[2 of 2 positions shown; findings below may reference images not displayed]

FINDINGS: The heart size and mediastinal contours are within normal limits.
Both lungs are clear. The visualized skeletal structures are
unremarkable.
IMPRESSION: No active cardiopulmonary disease.

## 2020-06-04 ENCOUNTER — Ambulatory Visit (INDEPENDENT_AMBULATORY_CARE_PROVIDER_SITE_OTHER): Payer: BC Managed Care – PPO | Admitting: Psychology

## 2020-06-04 DIAGNOSIS — M5481 Occipital neuralgia: Secondary | ICD-10-CM | POA: Diagnosis not present

## 2020-06-04 DIAGNOSIS — F410 Panic disorder [episodic paroxysmal anxiety] without agoraphobia: Secondary | ICD-10-CM | POA: Diagnosis not present

## 2020-06-04 DIAGNOSIS — G43009 Migraine without aura, not intractable, without status migrainosus: Secondary | ICD-10-CM | POA: Diagnosis not present

## 2020-06-19 ENCOUNTER — Ambulatory Visit (INDEPENDENT_AMBULATORY_CARE_PROVIDER_SITE_OTHER): Payer: BC Managed Care – PPO | Admitting: Psychology

## 2020-06-19 DIAGNOSIS — F410 Panic disorder [episodic paroxysmal anxiety] without agoraphobia: Secondary | ICD-10-CM

## 2020-06-20 IMAGING — NM NUCLEAR MEDICINE HEPATOBILIARY IMAGING WITH GALLBLADDER EF
2 series · 12 of 12 positions shown · non-contrast
Comparison: None.

CLINICAL DATA: Epigastric pain

EXAM:
NUCLEAR MEDICINE HEPATOBILIARY IMAGING WITH GALLBLADDER EF
TECHNIQUE: Sequential images of the abdomen were obtained [DATE] minutes
following intravenous administration of radiopharmaceutical. After
oral ingestion of Ensure, gallbladder ejection fraction was
determined. At 60 min, normal ejection fraction is greater than 33%.
RADIOPHARMACEUTICALS:  5.2 mCi Bc-88m  Choletec IV

[Series 1: biliary · 4.14mm/px · 6 of 41 frames shown]
[frame 4/41]
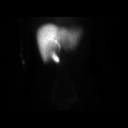
[frame 10/41]
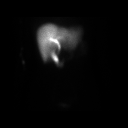
[frame 17/41]
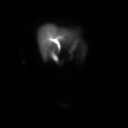
[frame 24/41]
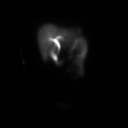
[frame 31/41]
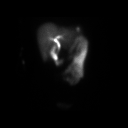
[frame 38/41]
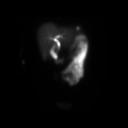

[Series 2: gbef · 4.14mm/px · 6 of 60 frames shown]
[frame 6/60]
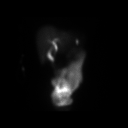
[frame 16/60]
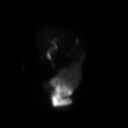
[frame 26/60]
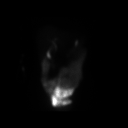
[frame 36/60]
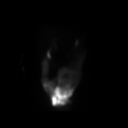
[frame 46/60]
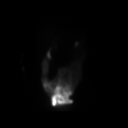
[frame 56/60]
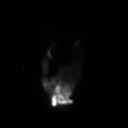

[12 of 12 positions shown; findings below may reference images not displayed]

FINDINGS: Prompt uptake and biliary excretion of activity by the liver is
seen. Gallbladder activity is visualized, consistent with patency of
cystic duct. Biliary activity passes into small bowel, consistent
with patent common bile duct.

Calculated gallbladder ejection fraction is 58%. (Normal gallbladder
ejection fraction with Ensure is greater than 33%.)
IMPRESSION: Normal uptake and excretion of biliary tracer.

Normal gallbladder ejection fraction.

## 2020-06-20 IMAGING — US ULTRASOUND ABDOMEN LIMITED
1 series · 14 of 25 positions shown · non-contrast
Comparison: None.

CLINICAL DATA: Right upper quadrant abdominal pain for 2 months.

EXAM:
ULTRASOUND ABDOMEN LIMITED RIGHT UPPER QUADRANT

[Series 1: ultrasound abdomen limited · 14 of 57 slices shown]
[im 1/57]
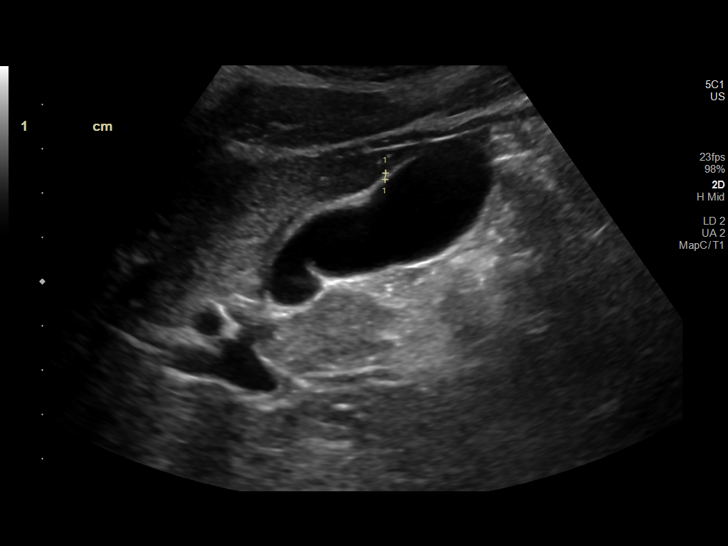
[im 5/57]
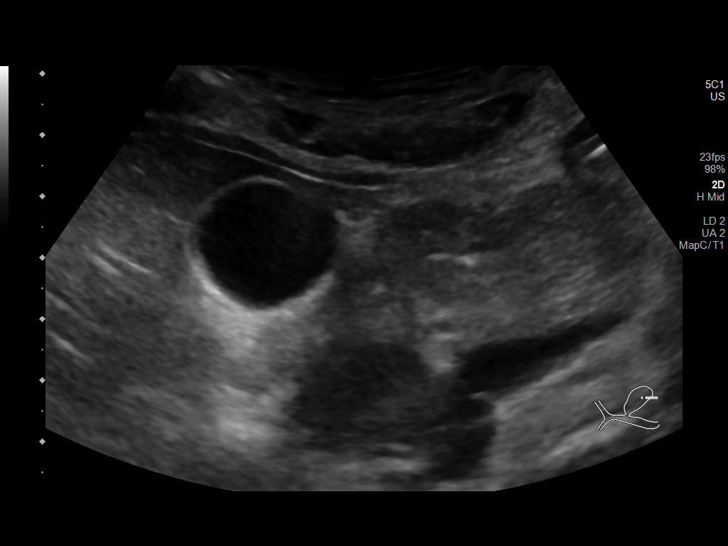
[im 10/57]
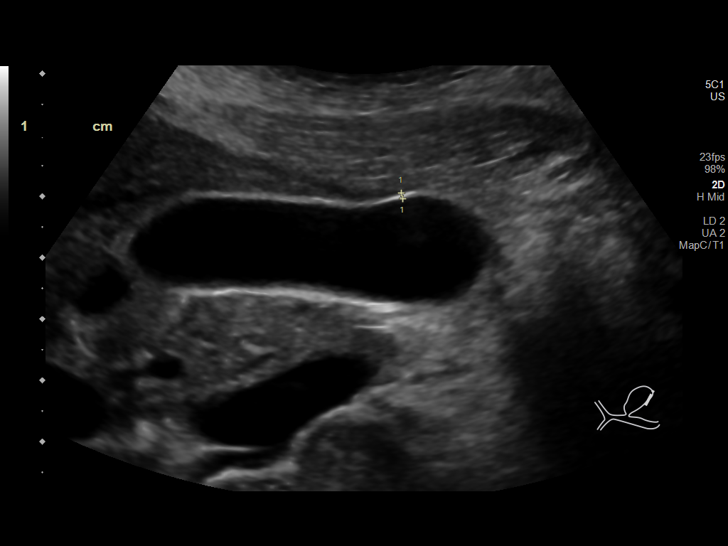
[im 15/57]
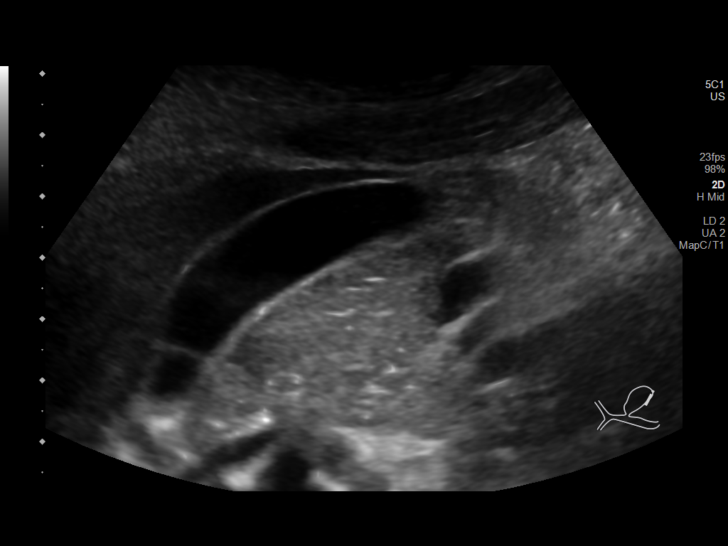
[im 19/57]
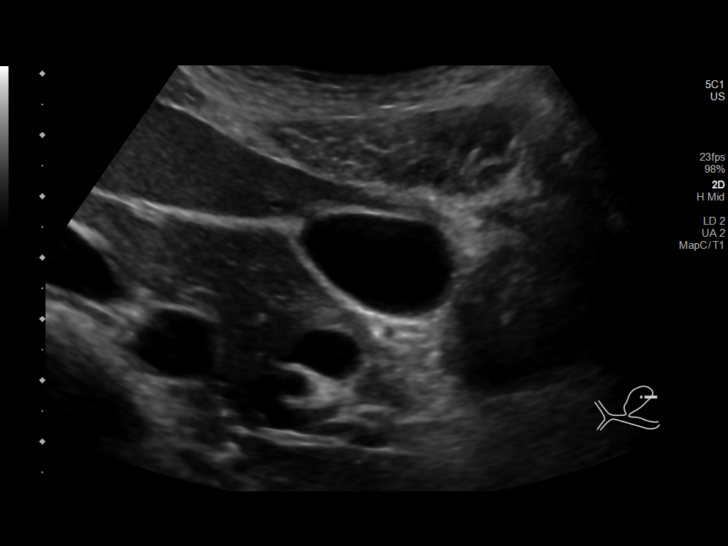
[im 22/57]
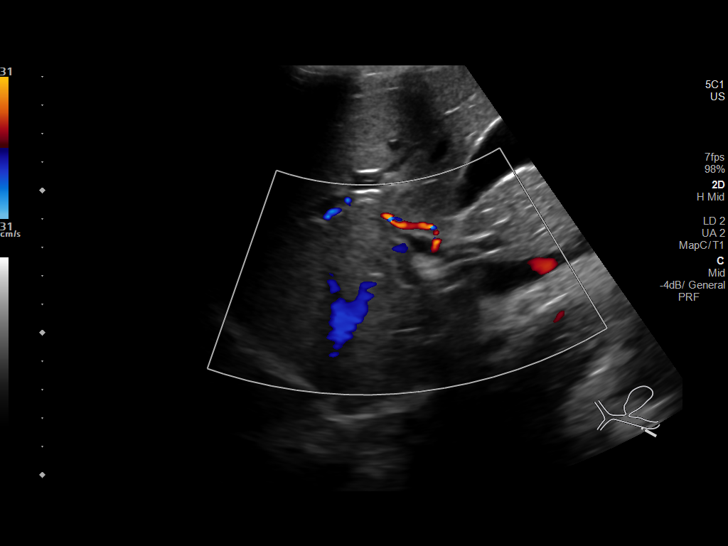
[im 26/57]
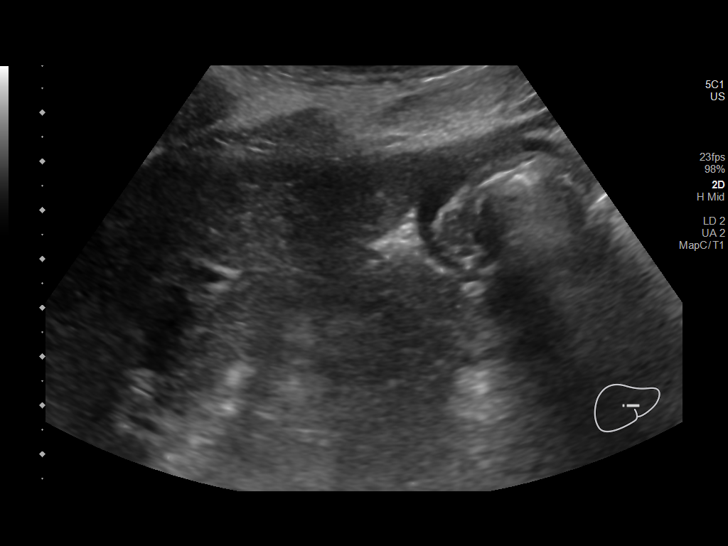
[im 31/57]
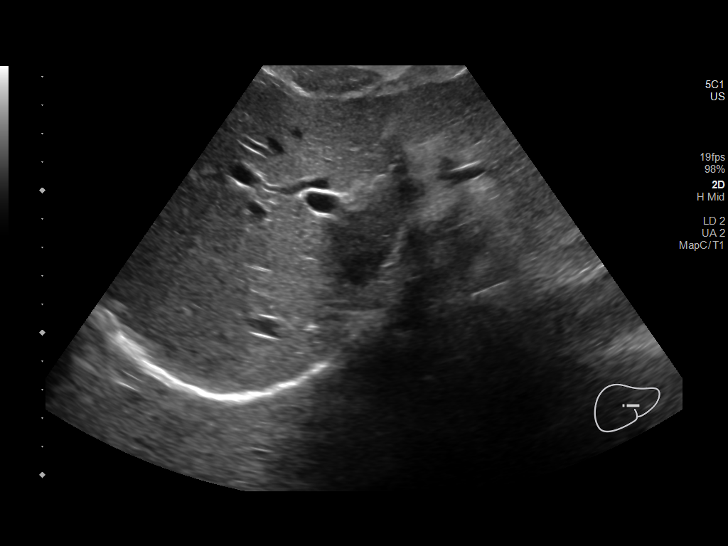
[im 36/57]
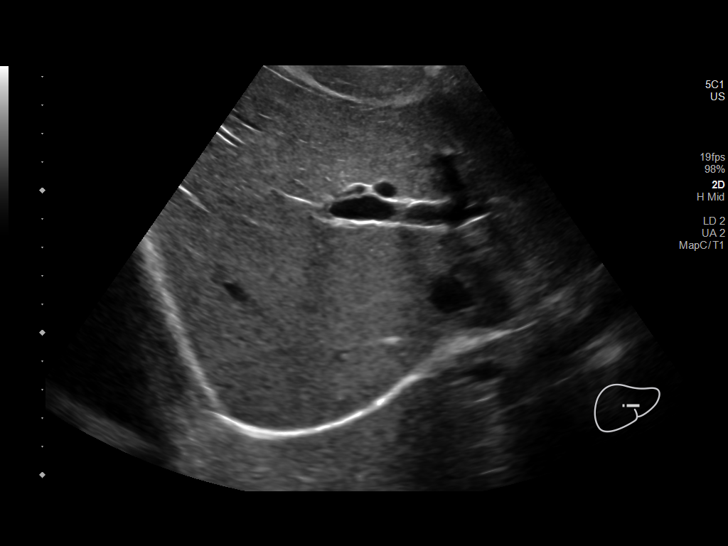
[im 38/57]
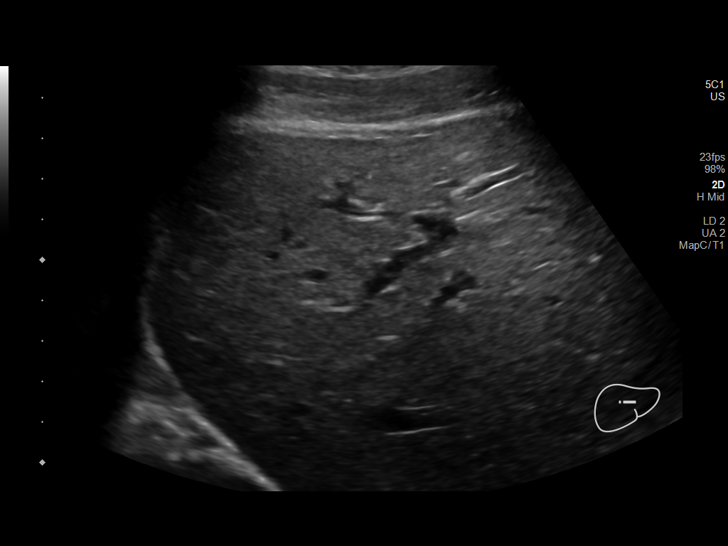
[im 43/57]
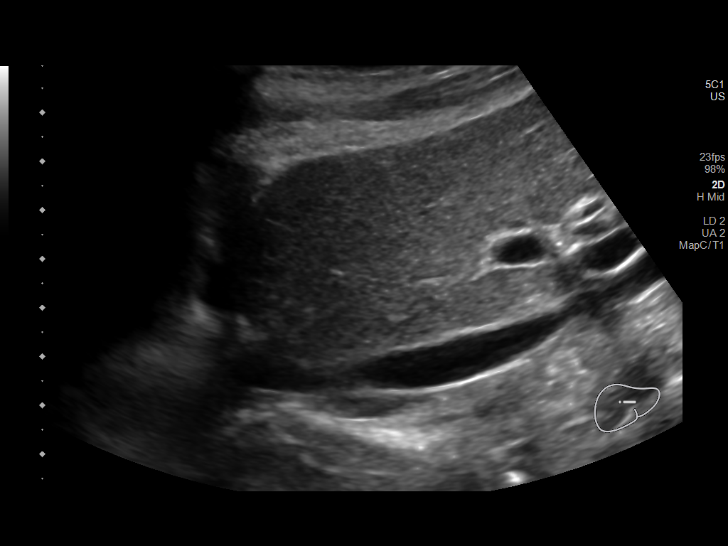
[im 47/57]
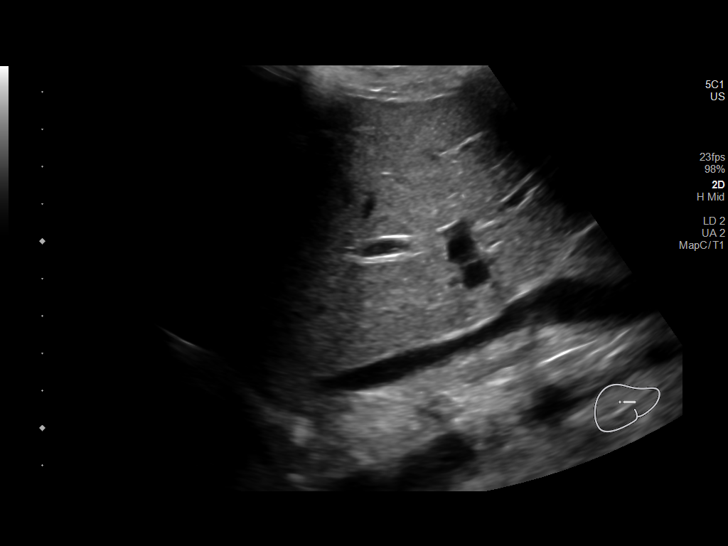
[im 52/57]
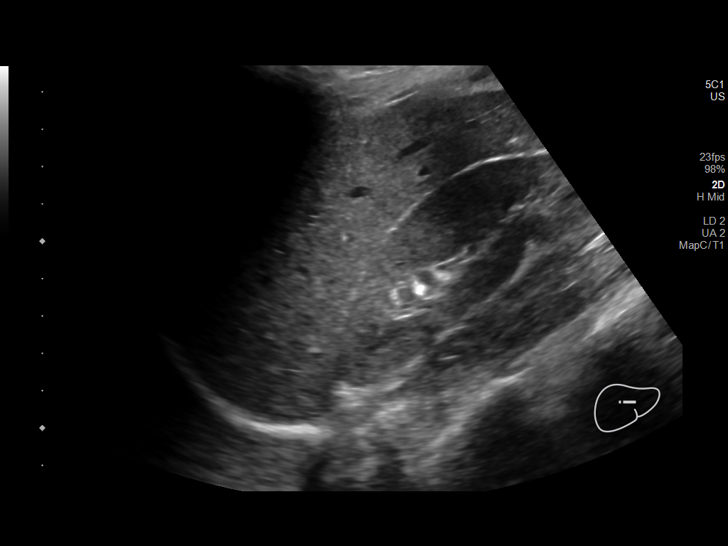
[im 57/57]
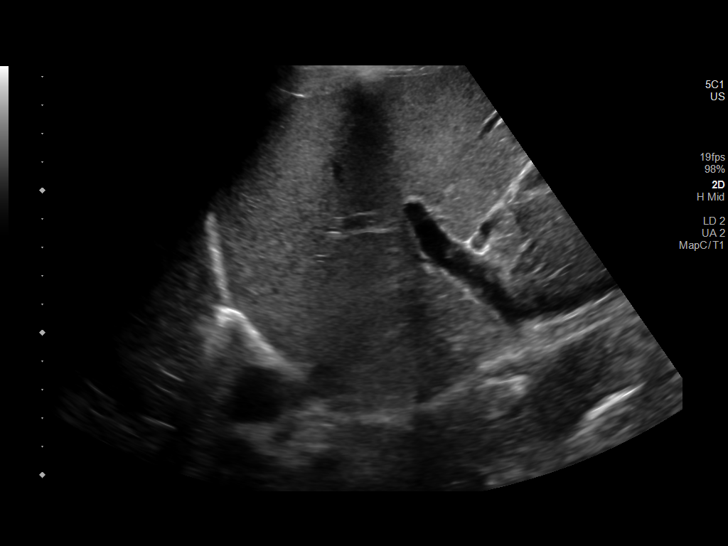

[14 of 25 positions shown; findings below may reference images not displayed]

FINDINGS: Gallbladder:

No gallstones or wall thickening visualized. No sonographic Murphy
sign noted by sonographer.

Common bile duct:

Diameter: 3 mm which is within normal limits.

Liver:

No focal lesion identified. Within normal limits in parenchymal
echogenicity. Portal vein is patent on color Doppler imaging with
normal direction of blood flow towards the liver.
IMPRESSION: No abnormality seen in the right upper quadrant of the abdomen.

## 2020-07-01 ENCOUNTER — Ambulatory Visit (INDEPENDENT_AMBULATORY_CARE_PROVIDER_SITE_OTHER): Payer: BC Managed Care – PPO | Admitting: Psychology

## 2020-07-01 DIAGNOSIS — F410 Panic disorder [episodic paroxysmal anxiety] without agoraphobia: Secondary | ICD-10-CM

## 2020-07-17 ENCOUNTER — Ambulatory Visit (INDEPENDENT_AMBULATORY_CARE_PROVIDER_SITE_OTHER): Payer: BC Managed Care – PPO | Admitting: Psychology

## 2020-07-17 DIAGNOSIS — F410 Panic disorder [episodic paroxysmal anxiety] without agoraphobia: Secondary | ICD-10-CM | POA: Diagnosis not present

## 2020-07-21 DIAGNOSIS — R202 Paresthesia of skin: Secondary | ICD-10-CM | POA: Diagnosis not present

## 2020-07-23 ENCOUNTER — Other Ambulatory Visit (HOSPITAL_COMMUNITY): Payer: Self-pay | Admitting: Internal Medicine

## 2020-07-23 ENCOUNTER — Other Ambulatory Visit: Payer: Self-pay | Admitting: Internal Medicine

## 2020-07-23 DIAGNOSIS — M542 Cervicalgia: Secondary | ICD-10-CM

## 2020-07-24 ENCOUNTER — Ambulatory Visit (INDEPENDENT_AMBULATORY_CARE_PROVIDER_SITE_OTHER): Payer: BC Managed Care – PPO | Admitting: Psychology

## 2020-07-24 DIAGNOSIS — F410 Panic disorder [episodic paroxysmal anxiety] without agoraphobia: Secondary | ICD-10-CM

## 2020-07-25 DIAGNOSIS — M47812 Spondylosis without myelopathy or radiculopathy, cervical region: Secondary | ICD-10-CM | POA: Diagnosis not present

## 2020-08-04 ENCOUNTER — Ambulatory Visit (INDEPENDENT_AMBULATORY_CARE_PROVIDER_SITE_OTHER): Payer: BC Managed Care – PPO | Admitting: Psychology

## 2020-08-04 DIAGNOSIS — M5481 Occipital neuralgia: Secondary | ICD-10-CM | POA: Diagnosis not present

## 2020-08-04 DIAGNOSIS — R519 Headache, unspecified: Secondary | ICD-10-CM | POA: Diagnosis not present

## 2020-08-04 DIAGNOSIS — F410 Panic disorder [episodic paroxysmal anxiety] without agoraphobia: Secondary | ICD-10-CM | POA: Diagnosis not present

## 2020-08-04 DIAGNOSIS — G4701 Insomnia due to medical condition: Secondary | ICD-10-CM | POA: Diagnosis not present

## 2020-08-18 ENCOUNTER — Ambulatory Visit (INDEPENDENT_AMBULATORY_CARE_PROVIDER_SITE_OTHER): Payer: 59 | Admitting: Psychology

## 2020-08-18 DIAGNOSIS — F410 Panic disorder [episodic paroxysmal anxiety] without agoraphobia: Secondary | ICD-10-CM | POA: Diagnosis not present

## 2020-08-27 ENCOUNTER — Ambulatory Visit (INDEPENDENT_AMBULATORY_CARE_PROVIDER_SITE_OTHER): Payer: 59 | Admitting: Psychology

## 2020-08-27 DIAGNOSIS — F410 Panic disorder [episodic paroxysmal anxiety] without agoraphobia: Secondary | ICD-10-CM | POA: Diagnosis not present

## 2020-09-10 ENCOUNTER — Ambulatory Visit: Payer: 59 | Admitting: Psychology

## 2020-09-25 ENCOUNTER — Ambulatory Visit: Payer: 59 | Admitting: Psychology

## 2020-10-06 ENCOUNTER — Ambulatory Visit (INDEPENDENT_AMBULATORY_CARE_PROVIDER_SITE_OTHER): Payer: 59 | Admitting: Psychology

## 2020-10-06 DIAGNOSIS — F410 Panic disorder [episodic paroxysmal anxiety] without agoraphobia: Secondary | ICD-10-CM

## 2020-10-20 ENCOUNTER — Ambulatory Visit: Payer: 59 | Admitting: Psychology

## 2020-10-27 ENCOUNTER — Ambulatory Visit (INDEPENDENT_AMBULATORY_CARE_PROVIDER_SITE_OTHER): Payer: 59 | Admitting: Psychology

## 2020-10-27 DIAGNOSIS — F410 Panic disorder [episodic paroxysmal anxiety] without agoraphobia: Secondary | ICD-10-CM | POA: Diagnosis not present

## 2020-11-10 ENCOUNTER — Ambulatory Visit (INDEPENDENT_AMBULATORY_CARE_PROVIDER_SITE_OTHER): Payer: 59 | Admitting: Psychology

## 2020-11-10 DIAGNOSIS — F410 Panic disorder [episodic paroxysmal anxiety] without agoraphobia: Secondary | ICD-10-CM

## 2020-11-24 ENCOUNTER — Ambulatory Visit (INDEPENDENT_AMBULATORY_CARE_PROVIDER_SITE_OTHER): Payer: 59 | Admitting: Psychology

## 2020-11-24 DIAGNOSIS — F410 Panic disorder [episodic paroxysmal anxiety] without agoraphobia: Secondary | ICD-10-CM | POA: Diagnosis not present

## 2020-12-08 ENCOUNTER — Ambulatory Visit (INDEPENDENT_AMBULATORY_CARE_PROVIDER_SITE_OTHER): Payer: 59 | Admitting: Psychology

## 2020-12-08 DIAGNOSIS — F410 Panic disorder [episodic paroxysmal anxiety] without agoraphobia: Secondary | ICD-10-CM

## 2020-12-28 ENCOUNTER — Ambulatory Visit: Payer: 59 | Admitting: Psychology

## 2021-01-11 ENCOUNTER — Ambulatory Visit (INDEPENDENT_AMBULATORY_CARE_PROVIDER_SITE_OTHER): Payer: 59 | Admitting: Psychology

## 2021-01-11 DIAGNOSIS — F410 Panic disorder [episodic paroxysmal anxiety] without agoraphobia: Secondary | ICD-10-CM

## 2021-01-26 ENCOUNTER — Ambulatory Visit (INDEPENDENT_AMBULATORY_CARE_PROVIDER_SITE_OTHER): Payer: 59 | Admitting: Psychology

## 2021-01-26 DIAGNOSIS — F410 Panic disorder [episodic paroxysmal anxiety] without agoraphobia: Secondary | ICD-10-CM | POA: Diagnosis not present

## 2021-02-10 ENCOUNTER — Ambulatory Visit (INDEPENDENT_AMBULATORY_CARE_PROVIDER_SITE_OTHER): Payer: 59 | Admitting: Psychology

## 2021-02-10 DIAGNOSIS — F410 Panic disorder [episodic paroxysmal anxiety] without agoraphobia: Secondary | ICD-10-CM | POA: Diagnosis not present

## 2021-02-24 ENCOUNTER — Ambulatory Visit (INDEPENDENT_AMBULATORY_CARE_PROVIDER_SITE_OTHER): Payer: 59 | Admitting: Psychology

## 2021-02-24 DIAGNOSIS — F410 Panic disorder [episodic paroxysmal anxiety] without agoraphobia: Secondary | ICD-10-CM

## 2021-03-09 ENCOUNTER — Ambulatory Visit (INDEPENDENT_AMBULATORY_CARE_PROVIDER_SITE_OTHER): Payer: 59 | Admitting: Psychology

## 2021-03-09 DIAGNOSIS — F410 Panic disorder [episodic paroxysmal anxiety] without agoraphobia: Secondary | ICD-10-CM | POA: Diagnosis not present

## 2021-03-29 ENCOUNTER — Ambulatory Visit (INDEPENDENT_AMBULATORY_CARE_PROVIDER_SITE_OTHER): Payer: 59 | Admitting: Psychology

## 2021-03-29 DIAGNOSIS — F410 Panic disorder [episodic paroxysmal anxiety] without agoraphobia: Secondary | ICD-10-CM

## 2021-05-10 DIAGNOSIS — H9191 Unspecified hearing loss, right ear: Secondary | ICD-10-CM | POA: Diagnosis not present

## 2021-05-10 DIAGNOSIS — H9313 Tinnitus, bilateral: Secondary | ICD-10-CM | POA: Diagnosis not present

## 2021-06-11 DIAGNOSIS — H9313 Tinnitus, bilateral: Secondary | ICD-10-CM | POA: Diagnosis not present

## 2021-06-11 DIAGNOSIS — H9193 Unspecified hearing loss, bilateral: Secondary | ICD-10-CM | POA: Diagnosis not present

## 2021-07-12 DIAGNOSIS — R7989 Other specified abnormal findings of blood chemistry: Secondary | ICD-10-CM | POA: Diagnosis not present

## 2021-07-13 DIAGNOSIS — Z3040 Encounter for surveillance of contraceptives, unspecified: Secondary | ICD-10-CM | POA: Diagnosis not present

## 2021-07-13 DIAGNOSIS — Z6820 Body mass index (BMI) 20.0-20.9, adult: Secondary | ICD-10-CM | POA: Diagnosis not present

## 2021-07-13 DIAGNOSIS — Z309 Encounter for contraceptive management, unspecified: Secondary | ICD-10-CM | POA: Diagnosis not present

## 2021-07-13 DIAGNOSIS — Z01419 Encounter for gynecological examination (general) (routine) without abnormal findings: Secondary | ICD-10-CM | POA: Diagnosis not present

## 2021-07-15 DIAGNOSIS — Z1331 Encounter for screening for depression: Secondary | ICD-10-CM | POA: Diagnosis not present

## 2021-07-15 DIAGNOSIS — G43909 Migraine, unspecified, not intractable, without status migrainosus: Secondary | ICD-10-CM | POA: Diagnosis not present

## 2021-07-15 DIAGNOSIS — Z23 Encounter for immunization: Secondary | ICD-10-CM | POA: Diagnosis not present

## 2021-07-15 DIAGNOSIS — Z0000 Encounter for general adult medical examination without abnormal findings: Secondary | ICD-10-CM | POA: Diagnosis not present

## 2021-07-15 DIAGNOSIS — Z1389 Encounter for screening for other disorder: Secondary | ICD-10-CM | POA: Diagnosis not present

## 2021-08-08 ENCOUNTER — Emergency Department (HOSPITAL_BASED_OUTPATIENT_CLINIC_OR_DEPARTMENT_OTHER): Payer: BC Managed Care – PPO

## 2021-08-08 ENCOUNTER — Other Ambulatory Visit: Payer: Self-pay

## 2021-08-08 ENCOUNTER — Encounter (HOSPITAL_BASED_OUTPATIENT_CLINIC_OR_DEPARTMENT_OTHER): Payer: Self-pay | Admitting: Urology

## 2021-08-08 ENCOUNTER — Emergency Department (HOSPITAL_BASED_OUTPATIENT_CLINIC_OR_DEPARTMENT_OTHER)
Admission: EM | Admit: 2021-08-08 | Discharge: 2021-08-08 | Disposition: A | Discharge status: Home / Self Care | Payer: BC Managed Care – PPO | Attending: Emergency Medicine | Admitting: Emergency Medicine

## 2021-08-08 DIAGNOSIS — R350 Frequency of micturition: Secondary | ICD-10-CM | POA: Diagnosis not present

## 2021-08-08 DIAGNOSIS — R102 Pelvic and perineal pain: Secondary | ICD-10-CM | POA: Insufficient documentation

## 2021-08-08 DIAGNOSIS — R109 Unspecified abdominal pain: Secondary | ICD-10-CM | POA: Diagnosis not present

## 2021-08-08 LAB — CBC WITH DIFFERENTIAL/PLATELET
Abs Immature Granulocytes: 0.01 10*3/uL (ref 0.00–0.07)
Basophils Absolute: 0 10*3/uL (ref 0.0–0.1)
Basophils Relative: 1 %
Eosinophils Absolute: 0.1 10*3/uL (ref 0.0–0.5)
Eosinophils Relative: 1 %
HCT: 42.2 % (ref 36.0–46.0)
Hemoglobin: 14.2 g/dL (ref 12.0–15.0)
Immature Granulocytes: 0 %
Lymphocytes Relative: 37 %
Lymphs Abs: 1.8 10*3/uL (ref 0.7–4.0)
MCH: 29.7 pg (ref 26.0–34.0)
MCHC: 33.6 g/dL (ref 30.0–36.0)
MCV: 88.3 fL (ref 80.0–100.0)
Monocytes Absolute: 0.5 10*3/uL (ref 0.1–1.0)
Monocytes Relative: 10 %
Neutro Abs: 2.6 10*3/uL (ref 1.7–7.7)
Neutrophils Relative %: 51 %
Platelets: 280 10*3/uL (ref 150–400)
RBC: 4.78 MIL/uL (ref 3.87–5.11)
RDW: 12.5 % (ref 11.5–15.5)
WBC: 5 10*3/uL (ref 4.0–10.5)
nRBC: 0 % (ref 0.0–0.2)

## 2021-08-08 LAB — COMPREHENSIVE METABOLIC PANEL
ALT: 14 U/L (ref 0–44)
AST: 23 U/L (ref 15–41)
Albumin: 4 g/dL (ref 3.5–5.0)
Alkaline Phosphatase: 44 U/L (ref 38–126)
Anion gap: 9 (ref 5–15)
BUN: 8 mg/dL (ref 6–20)
CO2: 24 mmol/L (ref 22–32)
Calcium: 9 mg/dL (ref 8.9–10.3)
Chloride: 106 mmol/L (ref 98–111)
Creatinine, Ser: 0.78 mg/dL (ref 0.44–1.00)
GFR, Estimated: >60 mL/min (ref >60)
Glucose, Bld: 110 mg/dL — ABNORMAL HIGH (ref 70–99)
Potassium: 3.8 mmol/L (ref 3.5–5.1)
Sodium: 139 mmol/L (ref 135–145)
Total Bilirubin: 0.2 mg/dL — ABNORMAL LOW (ref 0.3–1.2)
Total Protein: 7.4 g/dL (ref 6.5–8.1)

## 2021-08-08 LAB — LIPASE, BLOOD: Lipase: 36 U/L (ref 11–51)

## 2021-08-08 LAB — URINALYSIS, ROUTINE W REFLEX MICROSCOPIC
Bilirubin Urine: NEGATIVE
Glucose, UA: NEGATIVE mg/dL
Hgb urine dipstick: NEGATIVE
Ketones, ur: 40 mg/dL — AB
Leukocytes,Ua: NEGATIVE
Nitrite: NEGATIVE
Protein, ur: NEGATIVE mg/dL
Specific Gravity, Urine: 1.02 (ref 1.005–1.030)
pH: 7 (ref 5.0–8.0)

## 2021-08-08 LAB — PREGNANCY, URINE: Preg Test, Ur: NEGATIVE

## 2021-08-08 MED ORDER — FENTANYL CITRATE PF 50 MCG/ML IJ SOSY: 50.0000 ug | PREFILLED_SYRINGE | Freq: Once | INTRAMUSCULAR | Status: DC

## 2021-08-08 MED ORDER — LACTATED RINGERS IV BOLUS
1000.0000 mL | Freq: Once | INTRAVENOUS | Status: AC
  Administered 2021-08-08: 1000 mL via INTRAVENOUS

## 2021-08-08 MED ORDER — OXYCODONE-ACETAMINOPHEN 5-325 MG PO TABS
1.0000 | ORAL_TABLET | Freq: Once | ORAL | Status: AC
  Administered 2021-08-08: 1 via ORAL
  Filled 2021-08-08: qty 1

## 2021-08-08 MED ORDER — FENTANYL CITRATE PF 50 MCG/ML IJ SOSY
100.0000 ug | PREFILLED_SYRINGE | Freq: Once | INTRAMUSCULAR | Status: DC
Start: 2021-08-08 — End: 2021-08-08

## 2021-08-08 MED ORDER — IOHEXOL 300 MG/ML  SOLN
75.0000 mL | Freq: Once | INTRAMUSCULAR | Status: AC | PRN
  Administered 2021-08-08: 75 mL via INTRAVENOUS

## 2021-08-08 NOTE — ED Triage Notes (Signed)
States Right sided Pelvic pain that started 12/24, intermittently  Seen at [facility] today, started on antibiotic for presumed UTI told to come to ER if worsening pain to R/O appendicitis  Denies vaginal discharge or bleeding States urinary frequency but denies pain with urination

## 2021-08-08 NOTE — ED Notes (Signed)
Pt in CT.

## 2021-08-08 NOTE — Discharge Instructions (Addendum)
Continue to treat pain with ibuprofen every 6 hours.  When you get back to [place], you can follow-up with your OB/GYN doctor for further evaluation.  If you do experience worsening symptoms, please return to the emergency department.

## 2021-08-08 NOTE — ED Provider Notes (Signed)
[facility] EMERGENCY DEPARTMENT Provider Note   CSN: [id number] Arrival date & time: 08/08/21  1755     History  Chief Complaint  Patient presents with   Pelvic Pain    Kelsey Davidson is a 26 y.o. female who presents to the ED today with complaint of gradual onset, intermittent, right pelvic pain for the past 2 weeks.  Patient states that she first experienced the pain on 12/24.  She states that she did not think much of it and it went away.  She states that the pain returned again yesterday and was much worse and sharp in nature.  She also complains of urinary urgency.  She states that she went to urgent care today and had a urine sample done and is currently being treated for a possible urinary tract infection.  She states that they did an external vaginal exam but not a pelvic exam.  She states that she is not sexually active whatsoever and is not experiencing any vaginal discharge.  She does mention that she recently saw her OB/GYN earlier this month however it was prior to her experiencing the pain.  She had a reassuring exam at that time.  Her last Pap smear was last year which was normal.  She denies any fevers, chills, nausea, vomiting, diarrhea, lack of appetite, any other associated symptoms.   The history is provided by the patient and medical records.      Home Medications Prior to Admission medications   Medication Sig Start Date End Date Taking? Authorizing Provider  famotidine (PEPCID) 20 MG tablet Take 1 tablet (20 mg total) by mouth 2 (two) times daily. 02/01/19   Alvira Monday, MD  OMEPRAZOLE PO Take by mouth.  02/01/19  [provider]      Allergies    Patient has no known allergies.    Review of Systems   Review of Systems  Constitutional:  Negative for chills and fever.  Gastrointestinal:  Negative for abdominal pain, diarrhea, nausea and vomiting.  Genitourinary:  Positive for pelvic pain. Negative for flank pain, vaginal  discharge and vaginal pain.  All other systems reviewed and are negative.  Physical Exam Updated Vital Signs BP (!) 138/98 (BP Location: Right Arm)    Pulse 96    Temp 98.1 F (36.7 C) (Oral)    Resp 18    Ht 5\' 3"  (1.6 m)    Wt 49.9 kg    SpO2 100%    BMI 19.49 kg/m  Physical Exam Vitals and nursing note reviewed.  Constitutional:      Appearance: She is not ill-appearing or diaphoretic.  HENT:     Head: Normocephalic and atraumatic.  Eyes:     Conjunctiva/sclera: Conjunctivae normal.  Cardiovascular:     Rate and Rhythm: Normal rate and regular rhythm.     Pulses: Normal pulses.  Pulmonary:     Effort: Pulmonary effort is normal.     Breath sounds: Normal breath sounds. No wheezing, rhonchi or rales.  Abdominal:     Palpations: Abdomen is soft.     Tenderness: There is abdominal tenderness. There is no right CVA tenderness, left CVA tenderness, guarding or rebound.     Comments: Soft, + RLQ/right inguinal TTP with voluntary guarding, +BS throughout, no r/g/r, neg murphy's, neg mcburney's, no CVA TTP  Musculoskeletal:     Cervical back: Neck supple.  Skin:    General: Skin is warm and dry.  Neurological:     Mental Status: She  is alert.    ED Results / Procedures / Treatments   Labs (all labs ordered are listed, but only abnormal results are displayed) Labs Reviewed  URINALYSIS, ROUTINE W REFLEX MICROSCOPIC - Abnormal; Notable for the following components:      Result Value   Ketones, ur 40 (*)    All other components within normal limits  PREGNANCY, URINE  CBC WITH DIFFERENTIAL/PLATELET  COMPREHENSIVE METABOLIC PANEL  LIPASE, BLOOD    EKG None  Radiology No results found.  Procedures Procedures    Medications Ordered in ED Medications  oxyCODONE-acetaminophen (PERCOCET/ROXICET) 5-325 MG per tablet 1 tablet (1 tablet Oral Given 08/08/21 1850)    ED Course/ Medical Decision Making/ A&P                           Medical Decision Making 26 year old  female who presents to the ED today with complaint of intermittent right-sided pelvic pain starting on 12/24.  Urgent care today and treated for possible UTI however told to come to the ED if worsening pain to rule out possible appendicitis.  On arrival to the ED today patient is afebrile, nontachycardic and nontachypneic.  She appears to be in no acute distress at this time.  She is resting comfortably in bed with mom at bedside.  She is noted to have right lower quadrant abdominal/right inguinal tenderness ovation with some voluntary guarding.  No obvious McBurney's tenderness of bifurcation.  No hernia appreciated.  Patient denies any vaginal discharge.  She is adamant that she is not sexually active.  Given this I have very low suspicion for PID at this time.  She did have an external vaginal exam done at urgent care without any signs of lesions, Bartholin cyst, any other abnormality.  We will plan for abdominal labs at this time as well as ultrasound of the pelvis and abdomen to assess for torsion versus possible appendicitis.  Patient is very thin and I do feel the appendix will likely be visualized on ultrasound today.  Ultrasound is completely negative may consider CT scan however given pain has been present since 12/24 and intermittent in nature without any fevers, chills, nausea, vomiting, diarrhea I have lower suspicion for an acute surgical abdomen today.   Amount and/or Complexity of Data Reviewed Labs: ordered.    Details: Urine preg negative U/A without any signs of infection Radiology: ordered.   At shift change case signed out to attending physician Dr. Durwin Nora who will follow up on ultrasound/labs and dispo accordingly.   This note was prepared using Dragon voice recognition software and may include unintentional dictation errors due to the inherent limitations of voice recognition software.        Final Clinical Impression(s) / ED Diagnoses Final diagnoses:  Pelvic pain in  female    Rx / DC Orders ED Discharge Orders     None         Tanda Rockers, PA-C 08/08/21 1910    Gloris Manchester, MD 08/11/21 [phone]

## 2021-08-08 NOTE — ED Notes (Signed)
ED Provider at bedside. 

## 2021-08-11 DIAGNOSIS — K5900 Constipation, unspecified: Secondary | ICD-10-CM | POA: Diagnosis not present

## 2021-08-11 DIAGNOSIS — R102 Pelvic and perineal pain: Secondary | ICD-10-CM | POA: Diagnosis not present

## 2021-10-28 DIAGNOSIS — Z20822 Contact with and (suspected) exposure to covid-19: Secondary | ICD-10-CM | POA: Diagnosis not present

## 2021-11-04 DIAGNOSIS — R0781 Pleurodynia: Secondary | ICD-10-CM | POA: Diagnosis not present

## 2021-11-04 DIAGNOSIS — M2550 Pain in unspecified joint: Secondary | ICD-10-CM | POA: Diagnosis not present

## 2021-11-04 DIAGNOSIS — Z8616 Personal history of COVID-19: Secondary | ICD-10-CM | POA: Diagnosis not present

## 2021-11-04 DIAGNOSIS — R197 Diarrhea, unspecified: Secondary | ICD-10-CM | POA: Diagnosis not present

## 2022-04-17 ENCOUNTER — Ambulatory Visit: Payer: BLUE CROSS/BLUE SHIELD

## 2022-04-17 DIAGNOSIS — R300 Dysuria: Secondary | ICD-10-CM

## 2022-04-17 DIAGNOSIS — R102 Pelvic and perineal pain: Secondary | ICD-10-CM

## 2022-04-17 MED ORDER — NAPROXEN SODIUM 550 MG PO TABS
550 mg | ORAL_TABLET | Freq: Two times a day (BID) | ORAL | 0 refills | Status: AC
Start: 2022-04-17 — End: ?

## 2022-04-17 NOTE — Progress Notes
Outpatient Note    Patient: Megan Caldwell  MRN: [id number]  Date of Service: 04/17/2022    CC:   Chief Complaint   Patient presents with   ? Urinary Frequency   ? Dysuria       HPI: Megan Caldwell is a 26 y.o. female who is here for Urinary Frequency and Dysuria x 2 months. She reports having frequency 2 months ago. She developed dysuria last month and was treated with macrobid. Symptoms did not improve and patient was seen by GYN and treated with bactrim and for BV.  She reports having sharp lower abdominal and pelvic pain. She reports being on OCP and reports rare menses. She denies vaginal discharge.  She denies sexual activity in 4 years.     ALL:  No Known Allergies      EXAM:  Blood pressure 127/77, pulse 95, temperature 37.1 ?C (98.8 ?F), temperature source Tympanic, resp. rate 15, height 5' 3'' (1.6 m), last menstrual period 03/28/2022, SpO2 100 %. There is no height or weight on file to calculate BMI.  Gen: well nourished, well developed, no acute distress  Eyes: PERRLA, EOMI, no pain on eye movement, eyelids normal  Nose: nares patent, normal turbinates  Mouth: normal oropharynx, no tonsillar hypertrophy or exudates  Ears: TM's clear bilaterally, canals without swelling or redness bilaterally  Respiratory: Clear to auscultation bilaterally, no wheeze, rhonchi, rales  Cardiovascular: regular rate and rhythm, no murmurs  Abdomen: soft, non tender, non distended, normal bowel sound      Assessment and Plan  Diagnoses and all orders for this visit:    Dysuria  -     POCT urinalysis dipstick; Future  -     Bacterial Culture Urine, CLINIC COLLECT TODAY; Future    Pelvic pain  -     Bacterial Vaginosis Screen; Future  -     Chlamydia trachomatis/Neisseria gonorrhoeae PCR, Genital; Future  -     Trichomonas vaginalis Antigen, Genital Swab; Future  -     Fungal Culture, Genital Swab; Future  -     US pelvis transabd and transvaginal; Future  -     naproxen 550 mg tablet; Take 1 tablet (550 mg total) by mouth two (2) times daily with meals for 10 days.      Follow up with PCP if symptoms continue beyond 3 days.    35 minutes were spent personally by me today on this encounter which may include today's pre-visit review of the chart, time spent during the visit, and today's time spent after the visit  documenting and coordinating care.

## 2022-04-18 ENCOUNTER — Inpatient Hospital Stay: Payer: BLUE CROSS/BLUE SHIELD

## 2022-04-18 ENCOUNTER — Ambulatory Visit: Payer: BLUE CROSS/BLUE SHIELD

## 2022-04-18 DIAGNOSIS — R102 Pelvic and perineal pain: Secondary | ICD-10-CM

## 2022-04-18 LAB — Fungal Culture: FUNGAL CULTURE: NEGATIVE

## 2022-04-18 LAB — Bacterial Vaginosis Screen

## 2022-04-18 LAB — Trichomonas vaginalis Antigen: TRICHOMONAS VAGINALIS ANTIGEN: NEGATIVE

## 2022-04-19 LAB — Bacterial Culture Urine

## 2022-04-19 NOTE — Telephone Encounter
Seen 09/10 IC

## 2022-04-20 LAB — Fungal Culture: FUNGAL CULTURE: NEGATIVE

## 2022-04-20 LAB — Chlamydia trachomatis/Neisseria gonorrhoeae PCR: CHLAMYDIA TRACHOMATIS PCR: NEGATIVE

## 2022-04-26 ENCOUNTER — Telehealth: Payer: BLUE CROSS/BLUE SHIELD

## 2022-04-26 NOTE — Telephone Encounter
Results Request - The patient would like to discuss the results of their recent tests.     1) What type of test(s)?   urinalysis   2) When was it performed?   04/17/2022  3) Where was it performed? [facility]     If [facility], are results available in [facility]? YES  If outside facility, what is their phone number?     Patient or caller has been notified of the turnaround time of 1-2 business day(s).

## 2022-05-04 ENCOUNTER — Ambulatory Visit: Payer: BLUE CROSS/BLUE SHIELD

## 2022-05-04 DIAGNOSIS — R002 Palpitations: Secondary | ICD-10-CM

## 2022-05-04 DIAGNOSIS — Z23 Encounter for immunization: Secondary | ICD-10-CM

## 2022-05-04 DIAGNOSIS — R350 Frequency of micturition: Secondary | ICD-10-CM

## 2022-05-04 NOTE — Progress Notes
[facility] Primary Care  Internal Medicine-Pediatrics    PATIENT: Megan Caldwell  MRN: [id number]  DOB: September 20, 1995  DATE OF SERVICE: 05/04/2022  CHIEF COMPLAINT:   Chief Complaint   Patient presents with   ? Establish Care   ? Urinary Frequency     Seen at [facility] 09/10 and [place] was done 09/11   ? Palpitations     Possible related to anxiety         HPI   Megan Caldwell is a 26 y.o. female who presents for establishing care    Acute Concerns:    Urinary Urgency/Frequency  - Present for around 2 1/2 months  - increased frequency every 1-2 hours w/discomfort  - lower pelvic pain as well   - treated for UTI previously w/no relief of symptoms  - Saw OB/GYN where she was treated with abx for BV - no improvement  - Another provider a few weeks ago repeated urinary testing, including BV, Trichomonoas, Fungal Culture and GC/Chlamydia, all of which were negative  - US pelvis at the time negative for uterine abnormality   - around 9 months ago had US pelvis and CT AP w/contrast at outside facility for recurrent RLQ pain - both negative for acute abnormality   - currently seeing pelvic PT    Palpitations  - intermittent  - thinks trigger of anxiety   - no syncope, chest pain/pressure    Healthcare Maintenance  - Chronic Conditions:  - see PMH below  - Vaccines:   []  Covid vaccinated - eligible for fall 2023 shot   []  Flu vaccinated (if applicable)   [x]  Tetanus Booster w/in last 10 years   []  Shingles completed   []  PNA vaccine completed  - Screenings:  []  Screened for HIV   []  Screened for Hep B   []  Screened for Hep C   []  Colon Cancer Screening (if applicable) N/A   []  Breast Cancer Screening (if applicable) N/A   []  DEXA screening (if applicable) N/A   []  Other screens: due for Pap   PMH     Past Medical History:   Diagnosis Date   ? Anxiety     In feet (dancer)   ? Arthritis    ? Chicken pox     Had when I was a kid   ? Common migraine    ? GERD (gastroesophageal reflux disease)    ? Headache    ? Irritable bowel syndrome    ? Seasonal allergies        PSxH   No past surgical history on file.    FamHx     Family History   Problem Relation Age of Onset   ? Hearing loss Mother    ? Hypertension Mother    ? Alcohol abuse Father    ? Drug abuse Father    ? Diabetes Maternal Grandfather    ? Hypertension Maternal Grandfather    ? Arthritis Maternal Grandmother    ? Diabetes Maternal Grandmother    ? Hypertension Maternal Grandmother        SocHx     Social History     Tobacco Use   ? Smoking status: Never   ? Smokeless tobacco: Never   Substance Use Topics   ? Alcohol use: Yes     Alcohol/week: 0.6 oz     Types: 1 Glasses of Wine (5 oz) per week     Comment: Socially   ? Drug use: Never  ALL   No Known Allergies    Meds     Outpatient Medications Prior to Visit   Medication Sig   ? ALPRAZolam 0.5 mg tablet Take 1 tablet (0.5 mg total) by mouth as needed for.   ? busPIRone 5 mg tablet Take 1 tablet (5 mg total) by mouth two (2) times daily.   ? HAILEY 24 FE 1-20 MG-MCG(24) tablet daily.   ? LINZESS 72 MCG capsule daily.   ? phenazopyridine 200 mg tablet Take 1 tablet (200 mg total) by mouth three (3) times daily as needed.     No facility-administered medications prior to visit.       ROS   [x]  10-point ROS reviewed: normal unless stated in HPI              Review of Systems 05/02/2022   Fevers No   Unusual fatigue No   Very sudden vision change No   Eye pain or irritation No   Runny nose No   Sore throat No   Chest pain No   Swelling of legs/ankles No   Cough No   Shortness of breath No   Nausea Yes   Vomiting No   Abdominal pain Yes   Diarrhea Yes   Constipation Yes   Genital lesions No   Unusual discharge No   Pain on urination Yes   Body aches No   Joint aches No   Skin lesions No   Rash No   Weakness No   Numbness No   Depression No   Anxiety Yes   Weight change No   Hair loss No   Unusually frequent urination Yes   Bleeding No   Swollen lymph nodes (glands) No   Allergies Yes   Unusually frequent infections Yes   No flowsheet data found.     PHYSICAL EXAM     Last Recorded Vital Signs:    05/04/22 1133   BP: 117/76   Pulse: (!) 107   Resp: 14   Temp: 37.7 ?C (99.8 ?F)   SpO2: 99%     Body mass index is 19.66 kg/m?Marland Kitchen    General - Awake and alert, NAD  CVS - S1 and S2 heard, RRR (not tachycardic on auscultation), no murmurs  Pulm - Lungs CTAB, Good respiratory effort  GI - BS+, Nontender to palpation  Eyes - Clear conjunctiva  ENT - Oropharynx clear without exudate, Nares clear, Tympanic membranes normal  Neck - Supple with no lymphadenopathy  Psych - Anxious mood and affect    LABS/STUDIES   I have:   [x]  Reviewed/ordered []  1 []  2 [x]  ? 3 unique laboratory, radiology, and/or diagnostic tests noted below    []  Reviewed []  1 []  2 []  ? 3 prior external notes and incorporated into patient assessment    []  Discussed management or test interpretation with external provider(s) as noted       A&P   Megan Caldwell is a a 26 y.o. female presenting for the following:    1. Urinary frequency/urgency and dysuria  - no signs of infection on urine dipstick today  - monitoring further with UA and:   - Hgb A1c; Future  - concern for overactive bladder contributing to patient's symptoms  - will start with the above eval and routine labs before considering treatment for OAB  - Referral to Urology, Adult Urology for further eval     2. Palpitations  - ECG 12-Lead Clinic Performed - showing sinus  rhythm with short PR of 110  - monitoring:    - CBC; Future   - Comprehensive Metabolic Panel; Future   - TSH with reflex FT4, FT3; Future  - referral to Cardiology for further eval     3. Need for immunization against influenza  - Influenza vaccine IM;   PF - (Ambulatory Protocol - Influenza Vaccine)    4. Healthcare Maintenance  - Reviewed/Updated histories and med list.  - BP normal, BMI normal, Exam unremarkable.  - Routine/Screening Labs:   - as above  - Vaccines:   - will address at future visit  - Screenings:   - will address at future visit  - RTC in a few weeks for continued monitoring and considering of med for OAB    The above recommendations were discussed with the patient. The patient had all questions answered satisfactorily and is in agreement with this recommended plan of care.    Author:    Sena Hitch Markitta Ausburn   05/04/2022   12:06 PM

## 2022-05-10 ENCOUNTER — Ambulatory Visit: Payer: BLUE CROSS/BLUE SHIELD

## 2022-05-10 DIAGNOSIS — R300 Dysuria: Secondary | ICD-10-CM

## 2022-05-10 DIAGNOSIS — R9431 Abnormal electrocardiogram [ECG] [EKG]: Secondary | ICD-10-CM

## 2022-05-10 DIAGNOSIS — R3915 Urgency of urination: Secondary | ICD-10-CM

## 2022-05-10 DIAGNOSIS — Z0000 Encounter for general adult medical examination without abnormal findings: Secondary | ICD-10-CM

## 2022-05-11 ENCOUNTER — Telehealth: Payer: BLUE CROSS/BLUE SHIELD

## 2022-05-11 NOTE — Telephone Encounter
Call Back Request      Reason for call back: Pt would like to know if Dr. Dia Sitter could still see her even though she is under 44 due to the fact Orbie Pyo MD referred pt over to her for OAB.    Please advise.      Any Symptoms:  [x]  Yes  []  No  4 months     If yes, what symptoms are you experiencing:    o Duration of symptoms (how long): 4 months   o Have you taken medication for symptoms (OTC or Rx):  no    If call was taken outside of clinic hours:    [] Patient or caller has been notified that this message was sent outside of normal clinic hours.     [] Patient or caller has been warm transferred to the physician's answering service. If applicable, patient or caller informed to please call us back if symptoms progress.  Patient or caller has been notified of the turnaround time of 1-2 business day(s).

## 2022-05-12 ENCOUNTER — Ambulatory Visit: Payer: BLUE CROSS/BLUE SHIELD | Attending: Interventional Cardiology

## 2022-05-12 DIAGNOSIS — R9431 Abnormal electrocardiogram [ECG] [EKG]: Secondary | ICD-10-CM

## 2022-05-12 DIAGNOSIS — R002 Palpitations: Secondary | ICD-10-CM

## 2022-05-12 NOTE — Progress Notes
Outpatient Cardiology Consultation    PATIENT: Megan Caldwell  MRN: [id number]  DOB: 23-Jun-1996  DATE OF SERVICE: 05/12/2022      PRIMARY CARE PROVIDER: Laurina Bustle., MD  REFERRING PHYSICIAN: Clagg, Sena Hitch., MD      REASON FOR CONSULTATION:   Chief Complaint   Patient presents with   ? New Consult     Fast heart rate        HISTORY OF PRESENT ILLNESS:  Megan Caldwell is a very pleasant 26 y.o. female with notable past medical history of anxiety who presents today for evaluation of a fast heart beat.     No heart issues  Was at her PMD, pulse rate was 107, they did an EKG and noted a short PR interval and referred here to cardiology  Notes that she has some anxiety, has been anxious about her UTI like symptoms without answers  She feels like her heart has been racing, feels like her heart is beating hard  Not sure if fast heart at times other anxiety  Feels like it can start suddenly but slow offset  Then checks her watch in the 115-119  No irregularity   No syncope  Caffeine: no  Alcohol: rare  Sleep: poor, trouble staying asleep  Anxiety is GAD, seeing a therapist  ET: dancer    PAST MEDICAL HISTORY:  Past Medical History:   Diagnosis Date   ? Anxiety     In feet (dancer)   ? Arthritis    ? Chicken pox     Had when I was a kid   ? Common migraine    ? GERD (gastroesophageal reflux disease)    ? Headache    ? Irritable bowel syndrome    ? Seasonal allergies        PAST SURGICAL HISTORY:  No past surgical history on file.    OUTPATIENT MEDICATIONS:    Current Outpatient Medications:   ?  ALPRAZolam 0.5 mg tablet, Take 1 tablet (0.5 mg total) by mouth as needed for., Disp: , Rfl:   ?  busPIRone 5 mg tablet, Take 1 tablet (5 mg total) by mouth two (2) times daily., Disp: , Rfl:   ?  Cetirizine HCl (ZYRTEC ALLERGY PO), Take by mouth as needed for., Disp: , Rfl:   ?  HAILEY 24 FE 1-20 MG-MCG(24) tablet, daily., Disp: , Rfl:   ?  LINZESS 72 MCG capsule, daily., Disp: , Rfl:   ?  phenazopyridine 200 mg tablet, Take 1 tablet (200 mg total) by mouth three (3) times daily as needed. (Patient not taking: Reported on 05/12/2022.), Disp: , Rfl:     ALLERGIES:  No Known Allergies    SOCIAL HISTORY:  Social History     Socioeconomic History   ? Marital status: Single   Tobacco Use   ? Smoking status: Never   ? Smokeless tobacco: Never   Substance and Sexual Activity   ? Alcohol use: Yes     Alcohol/week: 0.6 oz     Types: 1 Glasses of Wine (5 oz) per week     Comment: Socially   ? Drug use: Never   ? Sexual activity: Not Currently     Partners: Male     Birth control/protection: Oral Contraceptive Pill   Other Topics Concern   ? Do you exercise at least a day, 3 or more days a week? Yes   ? Types of Exercise? (List in Comments) Yes  Comment: Basic gym cardio & dance classes   ? Do you follow a special diet? No   ? Vegan? No   ? Vegetarian? No   ? Pescatarian? No   ? Lactose Free? No   ? Gluten Free? No   ? Omnivore? No       FAMILY HISTORY:  Family History   Problem Relation Age of Onset   ? Hearing loss Mother    ? Hypertension Mother    ? Alcohol abuse Father    ? Drug abuse Father    ? Diabetes Maternal Grandfather    ? Hypertension Maternal Grandfather    ? Arthritis Maternal Grandmother    ? Diabetes Maternal Grandmother    ? Hypertension Maternal Grandmother        PHYSICAL EXAMINATION:  VITALS: BP 110/75  ~ Pulse 96  ~ Wt 111 lb (50.3 kg)  ~ LMP 04/27/2022 (Approximate)  ~ SpO2 100%  ~ BMI 19.66 kg/m?    General: well developed well nourished female in no acute distress  Heart: regular rate and rhythm, normal S1, S2. no rubs, murmurs, or gallops          LABORATORY:  Lab Results   Component Value Date    WBC 3.86 (L) 05/04/2022    HGB 14.6 05/04/2022    HCT 45.9 (H) 05/04/2022    MCV 93.5 05/04/2022    PLT 277 05/04/2022      Lab Results   Component Value Date    NA 140 05/04/2022    K 4.7 05/04/2022    CL 103 05/04/2022    CO2 26 05/04/2022    CREAT 0.83 05/04/2022    BUN 9 05/04/2022    GLUCOSE 103 (H) 05/04/2022       Lab Results   Component Value Date    ALT 16 05/04/2022    AST 32 05/04/2022    ALKPHOS 53 05/04/2022    BILITOT 0.2 05/04/2022     Lab Results   Component Value Date    TSH 1.5 05/04/2022      Lab Results   Component Value Date    CALCIUM 9.6 05/04/2022    No results found for: ''CHOL'', ''CHOLHDL'', ''CHOLDLCAL'', ''CHOLDLQ'', ''TRIGLY''  Lab Results   Component Value Date    HGBA1C 5.0 05/04/2022       2013 ACC/AHA guidelines recommends that patient is not in statin benefit group. Encourage adherence to heart-healthy lifestyle.  year ASCVD risk  cannot be calculated because at least one required variable is not available in [facility]. as of 11:08 AM on 05/12/2022      DIAGNOSTIC STUDIES:  ECG (05/04/2022) - Reviewed by me shows NSR, short PR, no pre-excitation, nl STT waves     ASSESSMENT/PLAN:  Megan Caldwell is a 26 y.o. female with:  1. Tachycardia/palpiations - likely sinus tachycardia, check Ziopatch to ensure  2. Short PR w/o pre-excitation - likely normal physiologic variation, Ziopatch    The above plan of care, diagnosis, orders, and follow-up were discussed with the patient.  Questions related to this recommended plan of care were answered.    Thank you Dr. Meliton Rattan for allowing me to participate in the care of your patient.    Bernie Covey, MD  Assistant Clinical Professor of Medicine  Division of Cardiology  [employer] at [facility] [employer] [employer] [facility]  Pager: [phone]  Email: [email or web address]

## 2022-05-12 NOTE — Telephone Encounter
Please Advise

## 2022-05-13 NOTE — Telephone Encounter
PDL Call to Clinic    Reason for Call:  Patient has a previous encounter from 05/11/22 about Dr. Dia Sitter seeing her because she's under the normal age .  She stated someone was supposed to call her back to let her know if Dr. Dia Sitter would take her since it was a referral. Doris in the office stated Dr. Dia Sitter was not in today to let the patient know to wait to hear from them.  Patient is a little anxious so I told her if by end of day she doesn't hear anything to try [place] back.    Appointment Related?  [x]  Yes  []  No     If yes;  Date: TBD  Time: TBD    Call warm transferred to PDL: []  Yes  [x]  No    Call Received by Clinic Representative:    If call not answered/not accepted, call received by Patient Services Representative:

## 2022-05-16 ENCOUNTER — Telehealth: Payer: BLUE CROSS/BLUE SHIELD

## 2022-05-16 DIAGNOSIS — R300 Dysuria: Secondary | ICD-10-CM

## 2022-05-16 DIAGNOSIS — R109 Unspecified abdominal pain: Secondary | ICD-10-CM

## 2022-05-16 DIAGNOSIS — Z8719 Personal history of other diseases of the digestive system: Secondary | ICD-10-CM

## 2022-05-16 DIAGNOSIS — R3915 Urgency of urination: Secondary | ICD-10-CM

## 2022-05-16 DIAGNOSIS — R350 Frequency of micturition: Secondary | ICD-10-CM

## 2022-05-16 DIAGNOSIS — R143 Flatulence: Secondary | ICD-10-CM

## 2022-05-16 MED ORDER — LINZESS 72 MCG PO CAPS
72 ug | ORAL_CAPSULE | Freq: Every day | ORAL | 5 refills | Status: AC
Start: 2022-05-16 — End: ?

## 2022-05-16 NOTE — Progress Notes
[facility] Primary Care  Internal Medicine-Pediatrics    PATIENT: Megan Caldwell  MRN: [id number]  DOB: 04-14-1996  DATE OF SERVICE: 05/16/2022    Patient Consent to Telehealth Questionnaire        No data to display              - I agree  to be treated via a video visit and acknowledge that I may be liable for any relevant copays or coinsurance depending on my insurance plan.  - I understand that this video visit is offered for my convenience and I am able to cancel and reschedule for an in-person appointment if I desire.  - I also acknowledge that sensitive medical information may be discussed during this video visit appointment and that it is my responsibility to locate myself in a location that ensures privacy to my own level of comfort.  - I also acknowledge that I should not be participating in a video visit in a way that could cause danger to myself or to those around me (such as driving or walking).  If my provider is concerned about my safety, I understand that they have the right to terminate the visit.     CHIEF COMPLAINT:   Chief Complaint   Patient presents with   ? Follow-up     Called for virtual rooming.  No answer, left message.  CM  Appt link sent via text message.         HPI   Megan Caldwell is a 26 y.o. female presents for   Chief Complaint   Patient presents with   ? Follow-up     Called for virtual rooming.  No answer, left message.  CM  Appt link sent via text message.      Urinary Urgency  - seeing pelvic floor therapy for her urinary urgency/frequency  - started around a month ago and does a session 1x a week  - feels like since last visit the symptoms have improved somewhat in terms of frequency  - can go a little longer w/o having to urinate   - while urinating, feels like she can empty her bladder  - feels like sx worsen after eating more so than drinking    Intermittent Abdominal Pain  - history of IBS on linzess  - notices more pressure in the abdomen/pelvis after eating - notices more belching   - has excessive gas with eating  - past pelvic imaging has been limited d/t bowel gas  - has taken OTC antacids at times, which has helped with symptoms  - saw outside GI in 2020 for which nuclear gallbladder scan showed normal function  - had barium swallow for dysphagia, which was normal   - also had reportedly normal EGD around 3 years ago     MEDS     Outpatient Medications Prior to Visit   Medication Sig   ? ALPRAZolam 0.5 mg tablet Take 1 tablet (0.5 mg total) by mouth as needed for.   ? busPIRone 5 mg tablet Take 1 tablet (5 mg total) by mouth two (2) times daily.   ? Cetirizine HCl (ZYRTEC ALLERGY PO) Take by mouth as needed for.   ? HAILEY 24 FE 1-20 MG-MCG(24) tablet daily.   ? LINZESS 72 MCG capsule daily.   ? phenazopyridine 200 mg tablet Take 1 tablet (200 mg total) by mouth three (3) times daily as needed. (Patient not taking: Reported on 05/12/2022.)  No facility-administered medications prior to visit.       PHYSICAL EXAM (limited due to this being a televisit)   (The following were examined and were normal unless noted otherwise)  Gen  [x]  Appearance  [x]  Alert/NAD    Eyes  [x]  Conjunctiva     Resp  [x]  Effort       A&P   Megan Caldwell is a a 26 y.o. female presenting for the following:    1. Urinary frequency/urgency and recurrent dysuria  - infection ruled out   - concern for overactive bladder  - patient notes improvement with pelvic floor therapy - would like to continue this before adding meds  - discussed possible use of oxybutynin   - Referral to Urology, Adult Urology for further eval    2. Excessive gas, intermittent abdominal pain and hx of IBS  - Neg EGD, gallbladder eval and barium swallow in the past  - concern for SIBO d/t excessive gas with every meal  - Referral to Gastroenterology for further eval  - refilled linzess 75 mcg daily    The above recommendation were discussed with the patient.  The patient has all questions answered satisfactorily and is in agreement with this recommended plan of care.    Author:    Sena Hitch Donneisha Beane   05/16/2022 9:33 AM

## 2022-05-17 ENCOUNTER — Ambulatory Visit: Payer: BLUE CROSS/BLUE SHIELD

## 2022-05-17 NOTE — Telephone Encounter
Message to Practice/Provider      Message: patient was assisted and rescheduled with  Torosis and confir,ed with coordinator that the  MD sees for the    R35.0 (ICD-10-CM) - Urinary frequency  R39.15 (ICD-10-CM) - Urinary urgency  R30.0 (ICD-10-CM) - Dysuria  Thank you    Return call is not being requested by the patient or caller.    Patient or caller has been notified of the turnaround time of 1-2 business day(s).

## 2022-05-17 NOTE — Telephone Encounter
LVM for patient to give [place] a call back regarding wanting to schedule an appointment.     Phone number was provided. [phone]

## 2022-05-18 ENCOUNTER — Ambulatory Visit: Payer: BLUE CROSS/BLUE SHIELD

## 2022-05-18 NOTE — Telephone Encounter
Tried calling patient to schedule appointment voicemail is full unable to leave voicemail

## 2022-05-19 ENCOUNTER — Ambulatory Visit: Payer: BLUE CROSS/BLUE SHIELD

## 2022-05-20 ENCOUNTER — Telehealth: Payer: BLUE CROSS/BLUE SHIELD

## 2022-05-23 ENCOUNTER — Ambulatory Visit: Payer: BLUE CROSS/BLUE SHIELD

## 2022-05-23 ENCOUNTER — Telehealth: Payer: BLUE CROSS/BLUE SHIELD

## 2022-05-23 NOTE — Telephone Encounter
PDL resolved by PS team  PS Team resolved PDL    [x] PS team warm transferred call to PDL    Call received by clinic Representative: Javier    [] Clinic staff provided PS team with info to relay to patient-request resolved    []  PS team assisted patient with request    Patient request:

## 2022-05-23 NOTE — Telephone Encounter
Call Back Request      Reason for call back:    [facility] in [place] is calling on behalf of mutual PT as Dr. Savitz, PTs physical therapist want to speak with Dr. Torosis in regards to mutual patient     Dr. Savitz has the signed the medical release waver from the PT to speak with Dr. Torosis.    Dr. Savitz call back number: [phone]  Any Symptoms:  [] Yes  [x] No      ? If yes, what symptoms are you experiencing:    o Duration of symptoms (how long):    o Have you taken medication for symptoms (OTC or Rx):      If call was taken outside of clinic hours:    []Patient or caller has been notified that this message was sent outside of normal clinic hours.     []Patient or caller has been warm transferred to the physician's answering service. If applicable, patient or caller informed to please call us back if symptoms progress.  Patient or caller has been notified of the turnaround time of 1-2 business day(s).

## 2022-05-23 NOTE — Telephone Encounter
Dr. Reather Converse call back number: [phone]

## 2022-05-23 NOTE — Telephone Encounter
Call Back Request      Reason for call back:    [facility] is calling on behalf of mutual PT as Dr. Chalmers Cater, PTs physical therapist want to speak with Dr. Kelli Churn in regards to mutual patient     Dr. Chalmers Cater has the signed the medical release waver from the PT to speak with Dr. Kelli Churn.    Dr. Chalmers Cater call back number: [phone]  Any Symptoms:  []  Yes  [x]  No      ? If yes, what symptoms are you experiencing:    o Duration of symptoms (how long):    o Have you taken medication for symptoms (OTC or Rx):      If call was taken outside of clinic hours:    [] Patient or caller has been notified that this message was sent outside of normal clinic hours.     [] Patient or caller has been warm transferred to the physician's answering service. If applicable, patient or caller informed to please call us back if symptoms progress.  Patient or caller has been notified of the turnaround time of 1-2 business day(s).

## 2022-05-23 NOTE — Telephone Encounter
Faxed clinical notes to number provided.

## 2022-05-23 NOTE — Telephone Encounter
PDL Call to Clinic    Reason for Call: Patient requesting to speak to office in regards to her auth for her zio patch visit    Appointment Related?  [x]  Yes  []  No     If yes;  Date: 05/23/2022  Time: 4:15 pm    Call warm transferred to PDL: []  Yes  [x]  No    Call Received by Clinic Representative:    If call not answered/not accepted, call received by Patient Services Representative: Marya Amsler

## 2022-05-23 NOTE — Telephone Encounter
Message to Practice/Provider      Message:  Katharine Look from [facility] is faxing over request for more information in regards to PA for today's heart monitor appointment scheduled at 4:15pm.    Return call is not being requested by the patient or caller.    Patient or caller has been notified of the turnaround time of 1-2 business day(s).

## 2022-05-24 ENCOUNTER — Telehealth: Payer: BLUE CROSS/BLUE SHIELD

## 2022-05-24 ENCOUNTER — Ambulatory Visit: Payer: BLUE CROSS/BLUE SHIELD

## 2022-05-24 DIAGNOSIS — N952 Postmenopausal atrophic vaginitis: Secondary | ICD-10-CM

## 2022-05-24 DIAGNOSIS — R350 Frequency of micturition: Secondary | ICD-10-CM

## 2022-05-24 DIAGNOSIS — M7918 Myalgia, other site: Secondary | ICD-10-CM

## 2022-05-24 DIAGNOSIS — N94819 Vulvodynia, unspecified: Secondary | ICD-10-CM

## 2022-05-24 NOTE — Telephone Encounter
PDL Call to Clinic    Reason for Call:  Patient calling to  Verify if the office has received fax from her physical therapist. She states PT have been faxing a form for Dr.Clagg to sign so she can continue care with them however theyhave not get any response back and the form has been faxed 3 times.  Appointment Related?  []  Yes  [x]  No     If yes;  Date:  Time:    Call warm transferred to PDL: [x]  Yes  []  No    Call Received by Clinic Representative:  leyla  If call not answered/not accepted, call received by Patient Services Representative:

## 2022-05-24 NOTE — Telephone Encounter
Received a PDL call from patient.    The patient is inquiring regarding a form that was faxed to our office multiple times: 10/09, 10/12, and today, 10/17. The form is needed for the patient to receive continuous physical therapy sessions. Unfortunately, the PT office had advised the patient that she could not receive any more sessions unless the form was signed by her PCP and faxed back to them.    Gerald Stabs, kindly confer with Dr. Scheryl Marten and follow up with the patient.     Thank you,    Jonah Blue, [facility] ~ Manager     [facility], [facility]  [address], [place]  [place], [place] [address]  Office: [phone] ext.[place]  Fax: [phone]

## 2022-05-24 NOTE — Consults
[facility]  Urogynecology Consultation Note  PATIENT: Megan Caldwell  MRN: [id number]  DOB: 11/02/1995  DATE OF SERVICE: 05/24/2022    REFERRING PRACTITIONER: No ref. provider found  REASON FOR REFERRAL: New Patient (Pt has hx of urinary frequency)     Subjective:     Chief Complaint:  Joetta Delprado is a 26 y.o. female referred for urinary urgency.     MBS:  Urinary frequency  Bother: 8/10    Urinary urgency which started in June. She has been treated several times for UTIs with negative cultures. She had BV. She has severe pain with pelvic exams. 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 August and September were the worse, hard to walk. Now things are better since starting PFPT.   Symptoms are worse when she is walking.  Nocturia 3 times   Extreme urge to urinate.    Currently she is going to PFPT weekly, Dr. Chalmers Cater  Voiding q99min-1hour   Been doing bladder training   Also not able to tolerate pelvic exams   Not able to be sexually active due to pain, not currently sexually active.     Started wearing incontinence pads JIC     Some bloating, previously having constipation and pain.     BMs are normal now because she is on linzess   Intermittent Abdominal Pain    On OCPs x 10 years   Amenorrheic currently    (click to expand/collapse)   Questionnaire responses:   Myc Uro Pelvic Floor Distress Inventory    05/24/2022 12:20 PM PDT - Ceasar Mons by Patient   Do you usually experience pressure in the lower abdomen? Yes   If Yes, how much does it bother you? Moderately   Do you usually experience heaviness or dullness in the lower abdomen? Yes   If Yes, how much does it bother you? Moderately   Do you usually have a bulge or something falling out that you can see or feel in the vaginal area? No   Do you usually have to push on the vagina or around the rectum to have a complete bowel movement? No   Do you usually experience a feeling of incomplete bladder emptying? Yes   If Yes, how much does it bother you? Somewhat   Do you ever have to push up in the vaginal area with your fingers to start or complete urination? No   Do you feel you need to strain too hard to have a bowel movement? Yes   If Yes, how much does it bother you? Somewhat   Do you feel you have not completely emptied your bowels at the end of a bowel movement? Yes   If Yes, how much does it bother you? Moderately   Do you usually lose stool beyond your control if your stool is well formed? No   Do you usually lose stool beyond your control if your stool is loose or liquid? No   Do you usually lose gas from the rectum beyond your control? No   Do you usually have pain when you pass your stool? Yes   If Yes, how much does it bother you? Somewhat   Do you experience a strong sense of urgency and have to rush to the bathroom to have a bowel movement? No   Does part of your bowel ever pass through the rectum and bulge outside during or after a bowel movement? No  Do you usually experience frequent urination? Yes   If Yes, how much does it bother you? Quite a bit   Do you usually experience urine leakage associated with a feeling of urgency: that is, a strong sensation of needing to go to the bathroom? Yes   If Yes, how much does it bother you? Quite a bit   Do you usually experience urine leakage related to laughing, coughing, or sneezing? No   Do you usually experience small amounts of urine leakage (that is, drops)? No   Do you usually experience difficulty emptying your bladder? Yes   If Yes, how much does it bother you? Moderately   Do you usually experience pain or discomfort in the lower abdomen or genital region? Yes   If Yes, how much does it bother you? Quite a bit   PFDI - POPDI (range: 0 - 24) 8   PFDI CRADI SCORE: (range: 0 - 32) 7   PFDI UDI Score: (range: 0 - 24) 15     Myc Uro-Iciq Fluts    05/24/2022 12:20 PM PDT - Filed by Patient   During the night, how many times do you have to get up to urinate, on average? Three   Which number best describes your AVERAGE pain or discomfort on the days you had it, over the last week? 6   Do you have a sudden need to rush to the toilet to urinate? Most of the time   How much does this bother you?  7   Do you have pain in your bladder? Sometimes   How much does this bother you? 7   How often do you pass urine during the day? Most of the time   How much does this bother you? 7   Is there a delay before you can start to urinate? Occasionally   How much does this bother you? 5   Do you have to strain to urinate? Occasionally   How much does this bother you? 5   Do you stop and start more than once while you urinate? Occasionally   How much does this bother you? 5   Does urine leak before you can get to the toilet? Occasionally   How much does this bother you? 5   How often do you leak urine? Occasionally   How much does this bother you? 5   Does urine leak when you are physically active, exert yourself, cough or sneeze? Sometimes   How much does this bother you? 7   Do you ever leak urine for no obvious reason and without feeling that you want to go? Never   How much does this bother you?    Do you leak urine when you are asleep? Never   How much does this bother you?    Female Urinary Symptoms - Filling (range: 0 - 16) 11   Female Urinary Symptoms - Voiding (range: 0 - 12) 3   Female Urinary Symptoms - Incontinence (range: 0 - 20) 4   Female Urinary Symptoms - Total Score (range: 0 - 48) 18     Myc Uro Female Genitourinary Pain Index (Fgupi)    05/24/2022 12:20 PM PDT - Filed by Patient   In the last week have you experienced any pain or discomfort in the following areas?    Entrance to vagina  yes    Vagina No   Urethra Yes    Below your waist, in your pubic or bladder area.  Yes  In the last week have you experienced:    Pain or burning during urination? Yes    Pain or discomfort during or after sexual intercourse? No   Pain or discomfort as your bladder fills? Yes    Pain or discomfort relieved by voiding?  Yes   How often have you had pain or discomfort in any of these areas over the last week?  sometimes   Which number best describes your AVERAGE pain or discomfort on the days you had it, over the last week? (range: 0 [No Pain] - 10 [Pain as bad as you can imagine]) 6    How often have you had a sensation of not emptying your bladder completely after you finished urinating, over the last week? Less than half the time   How often have you had to urinate again less than two hours after you finished urinating, over the last week?  almost always   How much have your symptoms kept you from doing the kinds of things you would usually do, over the last week?  A lot   How much did you think about your symptoms, over the last week? a lot   If you were to spend the rest of your life with your symptoms just the way they have been during the last week, how would you feel about that? Unhappy   FGUPI Pain Score: (range: 0 - 23) 14   FGUPI Urinary Score: (range: 0 - 10) 7   FGUPI QOL Score: (range: 0 - 12) 11       Past Medical History:   Diagnosis Date   ? Anxiety     In feet (dancer)   ? Arthritis    ? Chicken pox     Had when I was a kid   ? Common migraine    ? GERD (gastroesophageal reflux disease)    ? Headache    ? Irritable bowel syndrome    ? Seasonal allergies      No past surgical history on file.  Family History   Problem Relation Age of Onset   ? Hearing loss Mother    ? Hypertension Mother    ? Alcohol abuse Father    ? Drug abuse Father    ? Arthritis Maternal Grandmother    ? Diabetes Maternal Grandmother    ? Hypertension Maternal Grandmother    ? Diabetes Maternal Grandfather    ? Hypertension Maternal Grandfather    ? Sudden death Cousin         1st cousin once removed     OB History   No obstetric history on file.     No Known Allergies  Current Outpatient Medications   Medication Sig   ? ALPRAZolam 0.5 mg tablet Take 1 tablet (0.5 mg total) by mouth as needed for.   ? busPIRone 5 mg tablet Take 1 tablet (5 mg total) by mouth two (2) times daily.   ? Cetirizine HCl (ZYRTEC ALLERGY PO) Take by mouth as needed for.   ? HAILEY 24 FE 1-20 MG-MCG(24) tablet daily.   ? LINZESS 72 MCG capsule Take 1 capsule (72 mcg total) by mouth daily.   ? compounding medication Amitriptyline 2%/baclofen 2% Apply 1 click to vulva at bedtime with gradual increase up tot 3 times a day...   ? estradiol (ESTRACE) 0.1 mg/g vaginal cream Apply 0.5g (pea size amount) nightly for two weeks followed by twice per week thereafter..   ? phenazopyridine 200 mg tablet Take  1 tablet (200 mg total) by mouth three (3) times daily as needed. (Patient not taking: Reported on 05/12/2022.)     No current facility-administered medications for this visit.       Social History:   Social History     Socioeconomic History   ? Marital status: Single   Tobacco Use   ? Smoking status: Never   ? Smokeless tobacco: Never   Substance and Sexual Activity   ? Alcohol use: Yes     Alcohol/week: 0.6 oz     Types: 1 Glasses of Wine (5 oz) per week     Comment: Socially   ? Drug use: Never   ? Sexual activity: Not Currently     Partners: Male     Birth control/protection: Oral Contraceptive Pill   Other Topics Concern   ? Do you exercise at least a day, 3 or more days a week? Yes   ? Types of Exercise? (List in Comments) Yes     Comment: Basic gym cardio & dance classes   ? Do you follow a special diet? No   ? Vegan? No   ? Vegetarian? No   ? Pescatarian? No   ? Lactose Free? No   ? Gluten Free? No   ? Omnivore? No       Objective:     Physical Exam:      Vitals:  BP 147/93  ~ Pulse 87  ~ Ht 5' 3'' (1.6 m)  ~ Wt 110 lb (49.9 kg)  ~ LMP 04/27/2022 (Approximate)  ~ SpO2 100%  ~ BMI 19.49 kg/m?    General:   Vital woman, appears her stated age, no apparent distress   Back:  No costovertebral angle tenderness   Abdomen:   Soft and nondistended. No hepatosplenomegaly. No rectus diastasis. Nontender   GU:   Normal external female genitalia, Bartholin's and Skene's glands  Atrophic introitus   Q tip exam:   Circumferential tenderness to palpation with Qtip  Worse from 9 o'clock to 3 o'clock     Discriminate pelvic floor exam performed  Single digital exam on mid thigh to orient patient to pressure   Rate tenderness on scale of 0 to 10  R obturator internus: 8/10  R levator ani: 6/10  L levator ani: 6/10  L obturator internus: 9/10    Suburethral area: 0/10  Bladder: 0/10  Uterus: 0/10    Pelvic Floor Muscle Strength ([place]): 3/5  With impaired relaxation   Results for orders placed or performed in visit on 05/24/22   URO IN CLINIC PVR   Result Value Ref Range    Urine Volume 71 ml       A Trained Chaperone was  present for all sensitive exams, pelvic and breast exams included.   Musculoskeletal:  good symmetry and muscle bulk in lower extremities   Extremities:  No cyanosis, clubbing, or edema   Lymph Nodes:  No inguinal lymphadenopathy   Neurologic:   Alert & oriented x 4     (click to expand/collapse)   Lab Review:    Lab Results   Component Value Date    BACULUR Contamination suspected; Submit new specimen 04/17/2022       Lab Results   Component Value Date    WBC 3.86 (L) 05/04/2022    HGB 14.6 05/04/2022    HCT 45.9 (H) 05/04/2022    MCV 93.5 05/04/2022    PLT 277 05/04/2022     Lab Results  Component Value Date    BACULUR Contamination suspected; Submit new specimen 04/17/2022     Lab Results   Component Value Date    CREAT 0.83 05/04/2022    BUN 9 05/04/2022    NA 140 05/04/2022    K 4.7 05/04/2022    CL 103 05/04/2022    CO2 26 05/04/2022    ALT 16 05/04/2022    AST 32 05/04/2022    ALKPHOS 53 05/04/2022    BILITOT 0.2 05/04/2022    ALBUMIN 4.4 05/04/2022     No results found for: ''UAROUTINE''      Imaging:  No results found for this or any previous visit.           Assessment / Plan :     Impression:  In brief, Tyshika Baldridge is a 26 y.o. 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   Encounter Diagnoses   Name Primary?   ? Urinary frequency Yes   ? Myofascial pain    ? Vulvodynia    ? Vaginal atrophy        Plan:    # Myofascial frequency syndrome/HTPFD: with low pelvic discomfort and pressure as well as urinary frequency likely related to pelvic floor dysfunction. Pain unrelated to urination and symptoms elicited with palpation of hypertonic pelvic floor muscles. We had an extensive discussion, including the use of a pelvic floor model, regarding the origins and nature of the condition, including factors that may contribute to flare ups of symptoms. We discussed the treatment options for the management of this condition, for which pelvic floor physical therapy is the gold standard. We also discussed the use of adjunctive medications, such as vaginal muscle relaxants like vaginal valium, systemic muscle relaxants (like robaxin or cyclobenzaprine), nonmedicinal relaxation techniques, like hot baths and warm compresses, as well as lifestyle modifications, including stress management techniques, proper sleep hygiene, stretching and activity breaks for sedentary jobs, and ergonomically-friendly workplace and regular low-impact exercise. She asked many questions which were answered to her satisfaction, and she would like to proceed with physical therapy.  - Continue with PFPT   - Will follow up with Dr. Chalmers Cater     # Vestibulodynia   - Rx for Amitriptyline 2%/baclofen 2% sent to [facility] compounding pharmacy     # Atrophy   - Vaginal estrogen prescribed     RTC in 3 months     Donell Beers, MD 05/24/2022  Urogynecology

## 2022-05-24 NOTE — Telephone Encounter
It was placed in your inbox 10/12 for review. Placed it in front.

## 2022-05-24 NOTE — Telephone Encounter
Good morning,     Could you please assist them?      Thank you!

## 2022-05-24 NOTE — Telephone Encounter
PDL Call to Clinic    Reason for Call: Patient requesting to speak to a nurse in regards to authorization for holter monitor.    Appointment Related?  []  Yes  [x]  No     If yes;  Date:  Time:    Call warm transferred to PDL: [x]  Yes  []  No    Call Received by Clinic Representative: Mardene Celeste    If call not answered/not accepted, call received by Patient Services Representative:

## 2022-05-25 MED ORDER — COMPOUNDED MEDICATION - NON CONTROLLED SUBSTANCE
2 refills | Status: AC
Start: 2022-05-25 — End: ?

## 2022-05-25 MED ORDER — ESTRADIOL 0.1 MG/GM VA CREA
3 refills | Status: AC
Start: 2022-05-25 — End: ?

## 2022-05-25 NOTE — Telephone Encounter
Attempted to reach patient to give a status update on holter referral. No answer and v/m full. Will attempt to reach out again later today

## 2022-05-25 NOTE — Patient Instructions
[facility]   [address] [place] [address]   [place], [place]  Phone [phone]                Home Yoga Videos  We recommend participating in yoga activities either via a yoga class or through the following online classes which are geared specifically towards pelvic floor relaxation:    Yoga for Pelvic Pain - 10-minute Relief for Pelvic Pain and Discomfort  [email or web address]    Relax Your Pelvic Floor in 15 minutes - Release Pelvic Tension  [email or web address]    Hip and Pelvis Stretches for Pelvic Pain and Relaxation (Long Form)  [email or web address]    Pelvic Floor Release Stretches  [email or web address]

## 2022-05-26 ENCOUNTER — Telehealth: Payer: BLUE CROSS/BLUE SHIELD

## 2022-05-26 ENCOUNTER — Ambulatory Visit: Payer: BLUE CROSS/BLUE SHIELD

## 2022-05-26 NOTE — Telephone Encounter
PDL Call to Clinic    Reason for Call: Virgel Manifold from [facility] (?) calling again trying to reach Dr. Norvel Richards.    Caller mentions that they're playing phone tag. Also, informed her of Dr. Nani Gasser not being in office today.     Call back: [phone]    Appointment Related?  []  Yes  [x]  No     If yes;  Date:  Time:    Call warm transferred to PDL: []  Yes  [x]  No    Call Received by Clinic Representative: Doris    If call not answered/not accepted, call received by Patient Services Representative:

## 2022-05-26 NOTE — Telephone Encounter
Patient rescheduled to 1023

## 2022-05-26 NOTE — Telephone Encounter
PDL Call to Clinic    Reason for Call:Transferred the pt to the office for further assistance with scheduling with Dr.Storage.    Appointment Related?  [x]  Yes  []  No     If yes;  Date: 06-09-22  Time:14:00    Call warm transferred to PDL: [x]  Yes  []  No    Call Received by Clinic Representative:Yvette    If call not answered/not accepted, call received by Patient Services Representative:

## 2022-05-26 NOTE — Telephone Encounter
Call Back Request      Reason for call back: patient needs to reschedule todays holter monitor appt as her insurance has not cleared her.    Scheduled for 10 am today    Any Symptoms:  []  Yes  [x]  No       If yes, what symptoms are you experiencing:    o Duration of symptoms (how long):    o Have you taken medication for symptoms (OTC or Rx):      If call was taken outside of clinic hours:    [] Patient or caller has been notified that this message was sent outside of normal clinic hours.     [] Patient or caller has been warm transferred to the physician's answering service. If applicable, patient or caller informed to please call us back if symptoms progress.  Patient or caller has been notified of the turnaround time of 1-2 business day(s).

## 2022-05-30 ENCOUNTER — Ambulatory Visit: Payer: BLUE CROSS/BLUE SHIELD

## 2022-05-30 ENCOUNTER — Telehealth: Payer: BLUE CROSS/BLUE SHIELD

## 2022-05-30 NOTE — Telephone Encounter
Call Back Request      Reason for call back: patient calling to reschedule the holter monitor for today authorization has not been approved yet    Any Symptoms:  []  Yes  [x]  No       If yes, what symptoms are you experiencing:    o Duration of symptoms (how long):    o Have you taken medication for symptoms (OTC or Rx):      If call was taken outside of clinic hours:    [] Patient or caller has been notified that this message was sent outside of normal clinic hours.     [] Patient or caller has been warm transferred to the physician's answering service. If applicable, patient or caller informed to please call us back if symptoms progress.  Patient or caller has been notified of the turnaround time of 1-2 business day(s).

## 2022-05-30 NOTE — Telephone Encounter
Spoke to pt, helped schedule new appt or holter monitor-vc

## 2022-05-30 NOTE — Telephone Encounter
Spoke to pt rescheduled appt for 06/02/2022

## 2022-05-30 NOTE — Telephone Encounter
Call Back Request      Reason for call back: Patient called in and just spoke to financial department and was advised that her insurance denied her heart monitor for this Thursday and peer-to-peer needs to be set up as soon as possible. Thank you    Any Symptoms:  []  Yes  [x]  No       If yes, what symptoms are you experiencing:    o Duration of symptoms (how long):    o Have you taken medication for symptoms (OTC or Rx):      If call was taken outside of clinic hours:    [] Patient or caller has been notified that this message was sent outside of normal clinic hours.     [] Patient or caller has been warm transferred to the physician's answering service. If applicable, patient or caller informed to please call us back if symptoms progress.  Patient or caller has been notified of the turnaround time of 1-2 business day(s).

## 2022-05-31 NOTE — Telephone Encounter
Megan Caldwell from [facility] returning call [phone]. Call transferred to [facility].

## 2022-05-31 NOTE — Telephone Encounter
Peer to Peer has been scheduled.    06/03/2022 at 1:00pm - Dr. Derald Macleod will need to call [phone]  ( regarding Holter Monitor Denial) Case # [id number]

## 2022-05-31 NOTE — Telephone Encounter
Attempted to reach insurance to request peer to peer, but was not able to reach them. I left detailed voicemail requesting a call back. I will follow up Wednesday morning.     Spoke with patient and provided update. Advised MD will not be in clinic Wednesday or Thursday so if available, we would like for peer to peer to be on Friday. She is aware appt needs to be reschedule and I will call her to provide an update.

## 2022-06-01 NOTE — Telephone Encounter
Spoke with patient and informed her peer to peer has been set up for 06/03/22 at 1pm. If approved, patient may keep holter appt. I will update patient if the outcome.

## 2022-06-02 ENCOUNTER — Ambulatory Visit: Payer: BLUE CROSS/BLUE SHIELD

## 2022-06-03 ENCOUNTER — Ambulatory Visit: Payer: BLUE CROSS/BLUE SHIELD | Attending: Student in an Organized Health Care Education/Training Program

## 2022-06-03 ENCOUNTER — Institutional Professional Consult (permissible substitution): Payer: BLUE CROSS/BLUE SHIELD

## 2022-06-03 DIAGNOSIS — R002 Palpitations: Secondary | ICD-10-CM

## 2022-06-03 DIAGNOSIS — R000 Tachycardia, unspecified: Secondary | ICD-10-CM

## 2022-06-03 DIAGNOSIS — J029 Acute pharyngitis, unspecified: Secondary | ICD-10-CM

## 2022-06-03 NOTE — Procedures
ZIO placed per Dr.Gordin orders are for 14 DAYS Skin prepped and cleaned thoroughly. Device applied and pressed firmly across the entire device. Button pressed and light is flashing. Patient made aware that they may take a shower, but must be a quick and keep soap and lotion away from device. Patient is aware do not submerge device in water, such as baths or swimming pools. Patient made aware to please press button on monitor and document in the dairy with time, date,duration, and what symptom was felt and activity performed while it was felt. Explained at the end of 14 DAYS, to please remove device, place in pre-paid packaged and send device with dairy back to ZIO Patient made aware to allow 1-2 weeks for ZIO to receive and download results and follow up with provider.     Device #: [id number]     Date placed: 06/03/2022     Date to return: 11/110/2023

## 2022-06-03 NOTE — Telephone Encounter
PDL Call to Clinic    Reason for Call: Patient stated her appointment was made for Dr Storage but was now switched back to [facility]. Patient requested if she could switch to in person per Fara Olden in clinic it was okay to switch   Thank you      Appointment Related?  [x]  Yes  []  No     If yes;  Date:11/02  Time:2:00 pm    Call warm transferred to PDL: []  Yes  [x]  No    Call Received by Clinic Representative: Fara Olden    If call not answered/not accepted, call received by Patient Services Representative:

## 2022-06-04 LAB — Influenza A B RSV PCR: INFLUENZA A PCR: NOT DETECTED

## 2022-06-04 MED ORDER — AMOXICILLIN-POT CLAVULANATE 875-125 MG PO TABS
1 | ORAL_TABLET | Freq: Two times a day (BID) | ORAL | 0 refills | Status: AC
Start: 2022-06-04 — End: ?

## 2022-06-04 NOTE — Progress Notes
IMMEDIATE CARE VISIT    [facility]    Date of service: 06/03/2022    Patient: Megan Caldwell  Date of birth: 03-May-1996  [place] MRN: [id number]  PCP: Laurina Bustle., MD       SUBJECTIVE:     Chief Complaint   Patient presents with   ? Sinus Problem     Sinus congestion started a few days ago taking otc mucinex dm but not helping      ? Sore Throat     Tested negative for covid          26 year female with past medical history of anxiety, GERD, irritable bowel syndrome on treatment presents with chief complaint sinus congestion.  She is had this issue since Monday and it has not been getting better.  She denies any fevers chills inner significant pain over the molar teeth or retro orbitally.  She does not endorse having a recurrent history of sinus infections.  She endorses a dry cough as well.  Mucinex helping  Past Medical History:   Diagnosis Date   ? Anxiety     In feet (dancer)   ? Arthritis    ? Chicken pox     Had when I was a kid   ? Common migraine    ? GERD (gastroesophageal reflux disease)    ? Headache    ? Irritable bowel syndrome    ? Seasonal allergies      No past surgical history on file.  Outpatient Medications Prior to Visit   Medication Sig Dispense Refill   ? ALPRAZolam 0.5 mg tablet Take 1 tablet (0.5 mg total) by mouth as needed for.     ? busPIRone 5 mg tablet Take 1 tablet (5 mg total) by mouth two (2) times daily.     ? Cetirizine HCl (ZYRTEC ALLERGY PO) Take by mouth as needed for.     ? compounding medication Amitriptyline 2%/baclofen 2% Apply 1 click to vulva at bedtime with gradual increase up tot 3 times a day... 30 g 2   ? estradiol (ESTRACE) 0.1 mg/g vaginal cream Apply 0.5g (pea size amount) nightly for two weeks followed by twice per week thereafter.. 42.5 g 3   ? HAILEY 24 FE 1-20 MG-MCG(24) tablet daily.     ? LINZESS 72 MCG capsule Take 1 capsule (72 mcg total) by mouth daily. 30 capsule 5   ? phenazopyridine 200 mg tablet Take 1 tablet (200 mg total) by mouth three (3) times daily as needed. (Patient not taking: Reported on 05/12/2022.)       No facility-administered medications prior to visit.     No Known Allergies  Family History   Problem Relation Age of Onset   ? Hearing loss Mother    ? Hypertension Mother    ? Alcohol abuse Father    ? Drug abuse Father    ? Arthritis Maternal Grandmother    ? Diabetes Maternal Grandmother    ? Hypertension Maternal Grandmother    ? Diabetes Maternal Grandfather    ? Hypertension Maternal Grandfather    ? Sudden death Cousin         1st cousin once removed     Social History     Socioeconomic History   ? Marital status: Single   Tobacco Use   ? Smoking status: Never   ? Smokeless tobacco: Never   Substance and Sexual Activity   ? Alcohol use: Yes  Alcohol/week: 0.6 oz     Types: 1 Glasses of Wine (5 oz) per week     Comment: Socially   ? Drug use: Never   ? Sexual activity: Not Currently     Partners: Male     Birth control/protection: Oral Contraceptive Pill   Other Topics Concern   ? Do you exercise at least a day, 3 or more days a week? Yes   ? Types of Exercise? (List in Comments) Yes     Comment: Basic gym cardio & dance classes   ? Do you follow a special diet? No   ? Vegan? No   ? Vegetarian? No   ? Pescatarian? No   ? Lactose Free? No   ? Gluten Free? No   ? Omnivore? No       ROS  Review of symptoms is negative other than noted in the HPI.      OBJECTIVE:   BP 129/85  ~ Pulse (!) 119  ~ Temp 37.7 ?C (99.9 ?F) (Tympanic)  ~ Resp 18  ~ LMP 05/27/2022 (Approximate)  ~ SpO2 100%  There is no height or weight on file to calculate BMI.  Constitutional:  No acute distress. Alert. Normal mental status. Answers all questions appropriately.  Eyes: Clear sclera and conjunctiva.  ENT: Pharynx without erythema. No exudate or swelling in the posterior pharynx. MMM. Normal bilateral ear canals and tympanic membranes.   Neck: Supple. No adenopathy.  Lungs: No respiratory distress. Clear to auscultation bilaterally.  Heart / Chest: Regular rate and rhythm.  Abdomen: Soft and non distended. No TTP. Normal BS. No rebound / guarding.  MSK / Extremities: Normal appearance. No cyanosis or edema.  Skin: Skin color and temperature normal. No visible rashes or lesions.   Neurological: Grossly normal. A&O x 3. Gait is normal.    Psychiatric: Judgement and insight intact. Mood and affect are normal.     Physical Exam      Lab Review:  Office Visit on 06/03/2022   Component Date Value Ref Range Status   ? Rapid Strep A Screen, Manual 06/03/2022 Negative  Negative Final       Imaging:    Prior pertinent visits, outside records, labs, and imaging reviewed.     ASSESSMENT & PLAN:     1. Viral pharyngitis    2. Sore throat    3. Tachycardia      Orders Placed This Encounter   ? COVID-19  PCR/TMA, (Self) Mid-turbinate   ? Influenza A/B RSV PCR, Respiratory Upper   ? Bacterial Culture Throat   ? POCT rapid strep A   ? amoxicillin-clavulanate 875-125 mg tablet       Patient is a 26 y.o. year-old female presenting for signs and symptoms consistent with sinusitis. I discussed how majority of sinus infections are viral in nature. I explained signs suggestive of bacterial sinusitis including, fever, tooth pain, sudden worsening of symptoms. I also reviewed symptomatic treatment with nasal saline irrigation, medicated nasal sprays, and decongestants. I offered symptomatic treatment with or without antibiotics, she elected symptomatic therapy with antibiotics. There is no evidence of AOM, meningitis, deep space neck infection, or other emergent pathology on exam. Follow up with PMD and ER precautions advised.  I mentioned the patient she should try conservative therapy with Flonase and Neti pots if no improvement in 48 hours I recommended patient he is Augmentin as directed for possible superimposed bacterial sinus infection    Patient is wearing ZIO patch stating  that she has palpitations and follow up by Cardiology.  She is asymptomatic at this of shortness breath chest pain irregular.  She does endorse being somewhat nervous in the doctor's office    The above plan of care, diagnosis, orders, and follow-up were discussed with the patient, who understands and agrees.    Questions related to this recommended plan of care were answered.  See AVS for additional information and counseling materials provided to the patient.      Arlee Muslim, MD 06/03/2022   Immediate Care Physician

## 2022-06-05 LAB — COVID-19 PCR/TMA

## 2022-06-07 NOTE — Telephone Encounter
Approved.  

## 2022-06-09 ENCOUNTER — Ambulatory Visit: Payer: BLUE CROSS/BLUE SHIELD

## 2022-06-09 ENCOUNTER — Telehealth: Payer: BLUE CROSS/BLUE SHIELD

## 2022-06-09 DIAGNOSIS — R140 Abdominal distension (gaseous): Secondary | ICD-10-CM

## 2022-06-09 MED ORDER — COMPOUNDED MEDICATION - NON CONTROLLED SUBSTANCE
3 refills | 30.00000 days | Status: AC
Start: 2022-06-09 — End: ?

## 2022-06-09 NOTE — Progress Notes
OUTPATIENT GASTROENTEROLOGY NEW PATIENT CONSULT NOTE  06/09/2022    CONSULTANT:  Dr Carin Hock  REFERRING PROVIDER: Clagg, Sena Hitch., MD    Reason for Consultation: constipation and IBS-C    History of Present Illness:    Megan Caldwell is a 26 y.o.  female with history of anxiety, arthritis, and IBS-C who presents to establish care regarding IBS-C.     As a kid the patient would be constipated or would hold stools. As a teen and in college went days without going to the restroom but didn't notice until extreme pain then would void. In 2020 September symptoms worsened in that patient always felt nauseous. Was diagnosed with IBS-C in 2020 (EGD showed just reflux) and symptoms improved notably on trulance but due to insurance issues, switched to linzess which also worked. Moved from [facility] January 2022 and ran out of linzess. Started managing symptoms with miralax successfully. Over the past year she has had several visits/workup regarding UTIs and pelvic floor issues and was on abx in August 2023, which prompted her to get established with GI. She has continued to experience lower abdominal discomfort and bloating.     She has been on linzess since December 2022. She takes 72mg  daily. Has 1-2 BMs Types 3-4 daily. Has more success with passing a BM is linzess is taken right before a meal.      Past Medical History:    Past Medical History:   Diagnosis Date   ? Anxiety     In feet (dancer)   ? Arthritis    ? Chicken pox     Had when I was a kid   ? Common migraine    ? GERD (gastroesophageal reflux disease)    ? Headache    ? Irritable bowel syndrome    ? Seasonal allergies        Past Surgical History: wisdom teeth removal    Outpatient Medications:    Outpatient Medications Prior to Visit   Medication Sig   ? ALPRAZolam 0.5 mg tablet Take 1 tablet (0.5 mg total) by mouth as needed for.   ? busPIRone 5 mg tablet Take 1 tablet (5 mg total) by mouth two (2) times daily.   ? Cetirizine HCl (ZYRTEC ALLERGY PO) Take by mouth as needed for.   ? compounding medication Estradiol 0.03%/testosterone 0/15% in verbase. Apply 0.5g to vaginal opening every other night.. (Patient taking differently: Estradiol 0.03%/testosterone 0/15% in verbase. Apply 0.5g to vaginal opening every other night.Marland Kitchen)   ? estradiol (ESTRACE) 0.1 mg/g vaginal cream Apply 0.5g (pea size amount) nightly for two weeks followed by twice per week thereafter..   ? HAILEY 24 FE 1-20 MG-MCG(24) tablet daily.   ? LINZESS 72 MCG capsule Take 1 capsule (72 mcg total) by mouth daily.   ? amoxicillin-clavulanate 875-125 mg tablet Take 1 tablet by mouth two (2) times daily for 10 days. (Patient not taking: Reported on 06/09/2022.)   ? compounding medication Amitriptyline 2%/baclofen 2% Apply 1 click to vulva at bedtime with gradual increase up tot 3 times a day... (Patient not taking: Reported on 06/09/2022.)   ? phenazopyridine 200 mg tablet Take 1 tablet (200 mg total) by mouth three (3) times daily as needed. (Patient not taking: Reported on 05/12/2022.)     No facility-administered medications prior to visit.       Family History:    Family History   Problem Relation Age of Onset   ? Hearing loss Mother    ?  Hypertension Mother    ? Alcohol abuse Father    ? Drug abuse Father    ? Arthritis Maternal Grandmother    ? Diabetes Maternal Grandmother    ? Hypertension Maternal Grandmother    ? Diabetes Maternal Grandfather    ? Hypertension Maternal Grandfather    ? Sudden death Cousin         1st cousin once removed   No fhx of CRC or gastric cancer     Social History:  No tobacco, marijuana. 1x /week wine socially. Lives alone. 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    Review of Systems: A 12- point review of systems was negative except where noted in the HPI    Physical Examination:    Vital Signs: BP 112/80 (BP Location: Left arm, Patient Position: Sitting, Cuff Size: Regular)  ~ Pulse (!) 106  ~ Temp 37.3 ?C (99.2 ?F) (Forehead)  ~ Ht 5' 3'' (1.6 m)  ~ Wt 111 lb (50.3 kg)  ~ LMP 05/27/2022 (Approximate)  ~ SpO2 99%  ~ BMI 19.66 kg/m?   Gen: no acute distress, alert and oriented  HEENT: anicteric sclera, clear oropharynx.   Pulm: no respiratory distress, CTAB  CV: regular rate and rhythm  GEX:BMWU, nontender, nondistended, no rebound or guarding, no hepatomegaly   Ext: no peripheral edema     Laboratory Data: reviewed    Studies: reviewed    Impression:  [name] is a 26 y.o.  female with history of anxiety, arthritis, and IBS-C who presents to establish care regarding IBS-C.     Recommendations:    #IBS-C  - Discussed symptoms and jointly decided that Rosea would try to manage her symptoms with miralax as well as some of the recommendations listed below. She would like to not have to take linzess or trulance if a simple regimen will work  1. Psyllium Husk (you can get this at [employer]) - start with 1 teaspoon per day with 10 ounces of water. Increase by 1 teaspoon every few days until you reach 3 teaspoons which is 1 tablespoon. If you do not like the psyllium husk texture, you can try Metamucil or Benefiber.   2. Take 1 tablespoon of chia seeds daily   3. Drink 2.5 to 3 Liters of water per day   4. Mild to moderate exercise at least 30 minutes per day   5. Eat 2 Kiwis per day   6. Purchase CALM powder - start with 2.5 teaspoons every night. Increase by 1/2 a teaspoon every few nights until you reach your desired effect or maximum of 5 teaspoons.     Carin Hock, MD MPH  Clinical Instructor  [facility]

## 2022-06-09 NOTE — Telephone Encounter
Spoke with Dr. Reather Converse PFPT.   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 Dr. Reather Converse has made the same assessment and has started to work with the patient on myofascial release.   She is working with her on a weekly basis   Currently focusing on external myofascial release.  Has discussed with patient estrogen/testosterone cream vaginally instead of estrogen which was currently sent.

## 2022-06-15 ENCOUNTER — Telehealth: Payer: BLUE CROSS/BLUE SHIELD

## 2022-06-15 NOTE — Telephone Encounter
Call Back Request      Reason for call back: Patient is requesting for breath test order to be sent to [facility] so she can schedule. She does not want the test sent to her home due to the out of pocket cost. Please contact patient once order has been sent to [facility].     Any Symptoms:  []  Yes  [x]  No       If yes, what symptoms are you experiencing:    o Duration of symptoms (how long):    o Have you taken medication for symptoms (OTC or Rx):      If call was taken outside of clinic hours:    [] Patient or caller has been notified that this message was sent outside of normal clinic hours.     [] Patient or caller has been warm transferred to the physician's answering service. If applicable, patient or caller informed to please call back if symptoms progress.  Patient or caller has been notified of the turnaround time of 1-2 business day(s).

## 2022-06-16 ENCOUNTER — Ambulatory Visit: Payer: BLUE CROSS/BLUE SHIELD

## 2022-06-16 NOTE — Telephone Encounter
Referral has been faxed to [facility], patient notified via mychart.

## 2022-06-16 NOTE — Telephone Encounter
LVM, informed patient referral for SIBO is pending. Will notify via mychart once referral is ready for scheduling at [facility]. Also informed patient auth for SIBO does not guarantee zero financial responsibility. PT may request test cost at time of scheduling.

## 2022-06-18 ENCOUNTER — Ambulatory Visit: Payer: BLUE CROSS/BLUE SHIELD

## 2022-06-23 ENCOUNTER — Ambulatory Visit: Payer: BLUE CROSS/BLUE SHIELD

## 2022-06-24 ENCOUNTER — Ambulatory Visit: Payer: BLUE CROSS/BLUE SHIELD

## 2022-06-24 DIAGNOSIS — Z3041 Encounter for surveillance of contraceptive pills: Secondary | ICD-10-CM

## 2022-06-24 DIAGNOSIS — N941 Unspecified dyspareunia: Secondary | ICD-10-CM

## 2022-06-24 DIAGNOSIS — R102 Pelvic and perineal pain: Secondary | ICD-10-CM

## 2022-06-24 MED ORDER — NORETHINDRONE 0.35 MG PO TABS
.35 mg | Freq: Every day | ORAL | 3 refills | Status: AC
Start: 2022-06-24 — End: ?

## 2022-06-24 NOTE — H&P
[facility]  [facility]      Date of Encounter: 06/24/2022    Megan Caldwell DOB: [birth year]  (26 y.o.) MR: [id number]      SUBJECTIVE     Megan Caldwell is a 26 y.o. G0 who comes in today for pelvic pain consultation, referred by Kathrene Bongo, DPT ([facility]).      Menarche 26yo.  Menses irregular on Megan Caldwell, 3 days, heavy 2 days, at heaviest soaks in 4+ hours.      - AUB: started Megan Caldwell since 2018, tried DMPA in 2019 changed back for convenience. Prior OCP, bleeding multiple times per month, significantly heavier also.  Dysmenorrhea improved with improvement in volume of flow.     - Urinary urgency/frequency: painful since 01/2022, treated empirically for UTI multiple times without improvement.  Started pelvic PT in 04/2022, which has helped significantly.  Also followed by Dr Kelli Churn.      - Hormone-provoked vestibulodynia: tried topical estrogen for about 1 month (no burning, had some improvement).  Changed to estrogen/testosterone cream (VersaBase, has some burning) about 1-2 weeks ago, using every other day.     - Dyspareunia: almost always painful, insertion and deep, worst with insertion.  Had 03/2022 Abdominal/TVUS and was too painful to complete.  Has never been able to use a tampon.     - IBS/GERD: endoscopy 2020, normal; followed by GI (Dr Sharion Balloon), has SIBO testing today. Tried Linzess, GI recommended supplementation instead, so trying now.     Last colonoscopy: never    FHx: endometriosis (mother)    PMH: none  PSH: wisdom teeth    ROS / PMH     Brief ROS: General ROS: negative for - malaise, fatigue, fever, chills, night sweats, sleep disturbance, weight gain and weight loss  Psychological ROS: negative for - anxiety, concentration difficulties, depression, hallucinations, irritability, mood swings, obsessive thoughts, physical abuse, sexual abuse, sleep disturbances and suicidal ideation  Breast ROS: negative for - new or changing breast lumps, mastalgia, engorgement, nipple discharge, nipple bleeding and galactorrhea  Respiratory ROS: no cough, shortness of breath, or wheezing  Cardiovascular ROS: no chest pain, palpitations, or dyspnea on exertion  Gastrointestinal ROS: no nausea, vomiting, abdominal pain, change in bowel habits, black or bloody stools  Genito-Urinary ROS: urinary frequency/urgency; no dysuria or hematuria  Gynecologic ROS: pelvic pain, dyspareunia    I reviewed the patient's PMH, FamHx, SocHx, Medications and Allergies.  - She  has a past medical history of Anxiety, Arthritis, Chicken pox, Common migraine, GERD (gastroesophageal reflux disease), Headache, Irritable bowel syndrome, and Seasonal allergies.  - She  has no past surgical history on file.  - Her family history includes Alcohol abuse in her father; Arthritis in her maternal grandmother; Diabetes in her maternal grandfather and maternal grandmother; Drug abuse in her father; Hearing loss in her mother; Hypertension in her maternal grandfather, maternal grandmother, and mother; Sudden death in her cousin.  - Allergies: Patient has no known allergies.    OBJECTIVE     EXAM:  GENERAL: Alert, well appearing.  VITALS: BP 114/83  ~ Pulse 85  ~ Resp 19  ~ Ht 5' 3'' (1.6 m)  ~ Wt 109 lb (49.4 kg)  ~ LMP 05/27/2022 (Approximate)  ~ BMI 19.31 kg/m?   MENTAL STATUS: Alert, O x 3.  HEENT: Benign; Eyes. Ears, Nose Mouth all normal.  SKIN: Skin generally normal color, consistency, tone.  EXTREMITIES: All normal.  LUNGS: Nonlabored.  PELVIC: Deferred today.  LABS:  Results for orders placed or performed in visit on 06/03/22   COVID-19  PCR/TMA, Nasopharyngeal    Specimen: Nasopharyngeal; Respiratory, Upper   Result Value Ref Range    Specimen Type Respiratory, Upper     COVID-19 PCR/TMA Not Detected Not Detected     Comment: A Not Detected (negative) test result does not preclude 2019-nCoV infection and should not be used as the sole basis for treatment or other patient management decisions. Not Detected (negative) results must be combined with clinical observations, patient history, and epidemiological information.     Influenza A/B RSV PCR, Respiratory Upper    Specimen: Nasopharyngeal; Respiratory, Upper   Result Value Ref Range    Influenza A PCR Not Detected Not Detected    Influenza B PCR Not Detected Not Detected    RSV PCR Not Detected Not Detected   POCT rapid strep A   Result Value Ref Range    Rapid Strep A Screen, Manual Negative Negative        ASSESS / PLAN     Diagnoses and all orders for this visit:    Pelvic pain  -     MR pelvis wo+w contrast; Future    Dyspareunia in female  -     MR pelvis wo+w contrast; Future    Encounter for surveillance of contraceptive pills  -     norethindrone (MICRONOR) 0.35 mg tablet; Take 1 tablet (0.35 mg total) by mouth daily.      Discussed history and symptoms.  With patient's permission, discussed case with PT Alexa Chalmers Cater prior to appointment.  Will relay findings/recs afterward also.     Recommend changing OCP to POP, in hope that it will help with hormone-provoked vestibulodynia.  Unfortunately, stopping all contraception is most likely to result in recurrence of AUB/HMB/dysmenorrhea.      Will continue estrogen/testosterone cream for another week, advised to stop if worsening, and will change to coconut oil base instead of VersaBase, hopefully will resolve burning sensation. In meantime also, could continue estrogen cream because was well-tolerated.     Lower concern for endometriosis based on dysmenorrhea improvement concurrent with improvement in menstrual flow on OCP.  Given dyspareunia and bladder pressure, reasonable to r/o.  Currently unable to tolerate detailed endo protocol TVUS, so will do MRI.     Plan to f/u for MRI results and then do physical exam.       Return for next steps after MRI.    45 minutes were spent personally by me today on this encounter which include today's pre-visit review of the chart, obtaining appropriate history, performing an evaluation, documentation and discussion of management with details supported within the note for today's visit. The time documented was exclusive of any time spent on the separately billed procedure.      Iantha Fallen. Stephan Minister DO, MPH, FACOG

## 2022-06-27 ENCOUNTER — Ambulatory Visit: Payer: BLUE CROSS/BLUE SHIELD

## 2022-06-27 ENCOUNTER — Ambulatory Visit: Payer: BLUE CROSS/BLUE SHIELD | Attending: Interventional Cardiology

## 2022-06-27 MED ORDER — XIFAXAN 550 MG PO TABS
550 mg | ORAL_TABLET | Freq: Three times a day (TID) | ORAL | 0 refills | Status: AC
Start: 2022-06-27 — End: ?

## 2022-06-27 NOTE — Telephone Encounter
Prior Berkley Harvey has been submitted to patient's insurance, waiting on determination    Megan Caldwell   Xifaxan 550MG  tablets  Status: PA Request~Created: November 20th, 2023~Sent: November 20th, 2023

## 2022-06-27 NOTE — Telephone Encounter
Prior Berkley Harvey has been approved     Hydrologist 550MG  tablets  Status: PA Response - Approved~Created: November 20th, 2023~Sent: November 20th, 2023    Called [facility] and informed them that the patient's prior November 22th, 2023 has been approved, they will reach out to the patient once its ready for pick up.     [facility] [phone] [facility] - [place] - [address] [place]   [address] [place], [place]   Phone:  [phone] Fax:  [phone]

## 2022-07-04 ENCOUNTER — Telehealth: Payer: BLUE CROSS/BLUE SHIELD | Attending: Interventional Cardiology

## 2022-07-04 ENCOUNTER — Telehealth: Payer: BLUE CROSS/BLUE SHIELD

## 2022-07-04 DIAGNOSIS — R002 Palpitations: Secondary | ICD-10-CM

## 2022-07-04 DIAGNOSIS — R9431 Abnormal electrocardiogram [ECG] [EKG]: Secondary | ICD-10-CM

## 2022-07-04 NOTE — Progress Notes
Outpatient Cardiology Consultation    PATIENT: Megan Caldwell  MRN: [id number]  DOB: 05/24/1996  DATE OF SERVICE: 07/04/2022      PRIMARY CARE PROVIDER: Laurina Bustle., MD  REFERRING PHYSICIAN: No ref. provider found      REASON FOR CONSULTATION:   Chief Complaint   Patient presents with   ? Follow-up     Holter results        HISTORY OF PRESENT ILLNESS:  Megan Caldwell is a very pleasant 26 y.o. female with notable past medical history of anxiety who presents today for evaluation of a fast heart beat.     No heart issues  Was at her PMD, pulse rate was 107, they did an EKG and noted a short PR interval and referred here to cardiology  Notes that she has some anxiety, has been anxious about her UTI like symptoms without answers  She feels like her heart has been racing, feels like her heart is beating hard  Not sure if fast heart at times other anxiety  Feels like it can start suddenly but slow offset  Then checks her watch in the 115-119  No irregularity   No syncope  Caffeine: no  Alcohol: rare  Sleep: poor, trouble staying asleep  Anxiety is GAD, seeing a therapist  ET: dancer    Interval History 07/04/2022  Things are getting better, but still has GAD  Still having a hard year, other health issues, have changed how things have changed in life    PAST MEDICAL HISTORY:  Past Medical History:   Diagnosis Date   ? Anxiety     In feet (dancer)   ? Arthritis    ? Chicken pox     Had when I was a kid   ? Common migraine    ? GERD (gastroesophageal reflux disease)    ? Headache    ? Irritable bowel syndrome    ? Seasonal allergies        PAST SURGICAL HISTORY:  No past surgical history on file.    OUTPATIENT MEDICATIONS:    Current Outpatient Medications:   ?  ALPRAZolam 0.5 mg tablet, Take 1 tablet (0.5 mg total) by mouth as needed for., Disp: , Rfl:   ?  busPIRone 5 mg tablet, Take 1 tablet (5 mg total) by mouth two (2) times daily., Disp: , Rfl:   ?  Cetirizine HCl (ZYRTEC ALLERGY PO), Take by mouth as needed for., Disp: , Rfl:   ?  compounding medication, Estradiol 0.03%/testosterone 0/15% in verbase. Apply 0.5g to vaginal opening every other night.. (Patient taking differently: Estradiol 0.03%/testosterone 0/15% in verbase. Apply 0.5g to vaginal opening every other night.Marland Kitchen), Disp: 30 g, Rfl: 3  ?  HAILEY 24 FE 1-20 MG-MCG(24) tablet, daily., Disp: , Rfl:   ?  norethindrone (MICRONOR) 0.35 mg tablet, Take 1 tablet (0.35 mg total) by mouth daily., Disp: 84 each, Rfl: 3  ?  rifAXIMin (XIFAXAN) 550 mg tablet, Take 1 tablet (550 mg total) by mouth three (3) times daily for 14 days., Disp: 42 tablet, Rfl: 0  ?  compounding medication, Amitriptyline 2%/baclofen 2% Apply 1 click to vulva at bedtime with gradual increase up tot 3 times a day... (Patient not taking: Reported on 06/09/2022.), Disp: 30 g, Rfl: 2  ?  estradiol (ESTRACE) 0.1 mg/g vaginal cream, Apply 0.5g (pea size amount) nightly for two weeks followed by twice per week thereafter.. (Patient not taking: Reported on 06/24/2022.), Disp: 42.5 g, Rfl: 3  ?  LINZESS 72 MCG capsule, Take 1 capsule (72 mcg total) by mouth daily. (Patient not taking: Reported on 06/24/2022.), Disp: 30 capsule, Rfl: 5  ?  phenazopyridine 200 mg tablet, Take 1 tablet (200 mg total) by mouth three (3) times daily as needed. (Patient not taking: Reported on 05/12/2022.), Disp: , Rfl:     ALLERGIES:  No Known Allergies    SOCIAL HISTORY:  Social History     Socioeconomic History   ? Marital status: Single   Tobacco Use   ? Smoking status: Never   ? Smokeless tobacco: Never   Substance and Sexual Activity   ? Alcohol use: Yes     Alcohol/week: 0.6 oz     Types: 1 Glasses of Wine (5 oz) per week     Comment: Socially   ? Drug use: Never   ? Sexual activity: Not Currently     Partners: Male     Birth control/protection: Oral Contraceptive Pill   Other Topics Concern   ? Do you exercise at least a day, 3 or more days a week? Yes   ? Types of Exercise? (List in Comments) Yes     Comment: Basic gym cardio & dance classes   ? Do you follow a special diet? No   ? Vegan? No   ? Vegetarian? No   ? Pescatarian? No   ? Lactose Free? No   ? Gluten Free? No   ? Omnivore? No       FAMILY HISTORY:  Family History   Problem Relation Age of Onset   ? Hearing loss Mother    ? Hypertension Mother    ? Alcohol abuse Father    ? Drug abuse Father    ? Arthritis Maternal Grandmother    ? Diabetes Maternal Grandmother    ? Hypertension Maternal Grandmother    ? Diabetes Maternal Grandfather    ? Hypertension Maternal Grandfather    ? Sudden death Cousin         1st cousin once removed           LABORATORY:  Lab Results   Component Value Date    WBC 3.86 (L) 05/04/2022    HGB 14.6 05/04/2022    HCT 45.9 (H) 05/04/2022    MCV 93.5 05/04/2022    PLT 277 05/04/2022      Lab Results   Component Value Date    NA 140 05/04/2022    K 4.7 05/04/2022    CL 103 05/04/2022    CO2 26 05/04/2022    CREAT 0.83 05/04/2022    BUN 9 05/04/2022    GLUCOSE 103 (H) 05/04/2022       Lab Results   Component Value Date    ALT 16 05/04/2022    AST 32 05/04/2022    ALKPHOS 53 05/04/2022    BILITOT 0.2 05/04/2022     Lab Results   Component Value Date    TSH 1.5 05/04/2022      Lab Results   Component Value Date    CALCIUM 9.6 05/04/2022    No results found for: ''CHOL'', ''CHOLHDL'', ''CHOLDLCAL'', ''CHOLDLQ'', ''TRIGLY''  Lab Results   Component Value Date    HGBA1C 5.0 05/04/2022       2013 ACC/AHA guidelines recommends that patient is not in statin benefit group. Encourage adherence to heart-healthy lifestyle.  year ASCVD risk  cannot be calculated because at least one required variable is not available in [facility]. as of 1:05 PM on  07/04/2022      DIAGNOSTIC STUDIES:  ECG (05/04/2022) - Reviewed by me shows NSR, short PR, no pre-excitation, nl STT waves     Ziopatch -  14d monitor  Sinus rhythm 60 - 177bpm, average 96bpm  Rare (<1%) ectopy  No sustained arrhythmia or block  All diary events and triggers are sinus rhythm or sinus tachycardia,  other than two triggers for isolated PVC    ASSESSMENT/PLAN:  Megan Caldwell is a 26 y.o. female with:  1. Tachycardia/palpiations - Ziopatch shows sinus tachycardia, discussed may be due to outside inputs although IST and POTS are a possibility, if feeling symptoms from her fast HR despite continuing to treat her other medical conditions and anxiety would obtain echocardiogram and see EP for assessment of these conditions  2. Short PR w/o pre-excitation - likely normal physiologic variation, no arrhythmia on Ziopatch    The above plan of care, diagnosis, orders, and follow-up were discussed with the patient.  Questions related to this recommended plan of care were answered.    Thank you Dr. Meliton Rattan for allowing me to participate in the care of your patient.    Bernie Covey, MD  Assistant Clinical Professor of Medicine  Division of Cardiology  [employer] at [facility]  Pager: [phone]  Email: [email or web address]

## 2022-07-04 NOTE — Telephone Encounter
Spoke with patient and let her know MD will be out, appt rescheduled.

## 2022-07-05 ENCOUNTER — Telehealth: Payer: BLUE CROSS/BLUE SHIELD

## 2022-07-05 NOTE — Telephone Encounter
Left VM as patient needs to schedule echo, appointment with Dr. Drucilla Schmidt, and follow-up with Dr. Delories Heinz (all appointments can be scheduled on the same day per Dr. Delories Heinz).

## 2022-07-06 ENCOUNTER — Ambulatory Visit: Payer: BLUE CROSS/BLUE SHIELD | Attending: Interventional Cardiology

## 2022-07-07 ENCOUNTER — Ambulatory Visit: Payer: BLUE CROSS/BLUE SHIELD

## 2022-07-11 ENCOUNTER — Ambulatory Visit: Payer: BLUE CROSS/BLUE SHIELD

## 2022-07-15 ENCOUNTER — Telehealth: Payer: BLUE CROSS/BLUE SHIELD

## 2022-07-15 NOTE — Telephone Encounter
PDL Call to Clinic    Reason for Call: Patient called asking to speak with Dr. Reinaldo Raddle nurse about the birth control pill, this prescription doesn't have the sugar or the placebo pill , and is following up on previous message attempt. And if she needs to take the pill everyday? Or to take the pill when you start the cycle? Is she never suppose to have a break from it?    It has been over a week since she hadnt got a response.         Appointment Related?  []  Yes  [x]  No     If yes;  Date:  Time:    Call warm transferred to PDL: []  Yes  [x]  No    Call Received by Clinic Representative: NO ANSWER FROM THE OFFICE     If call not answered/not accepted, call received by Patient Services Representative:

## 2022-07-19 NOTE — Telephone Encounter
PDL Call to Clinic    Reason for Call: Pt is requesting to speak with Dr. Reinaldo Raddle nurse. Please see previous message for details. Pt has not received a call back within the  allotted time frame she was given.     Appointment Related?  []  Yes  [x]  No     If yes;  Date:  Time:    Call warm transferred to PDL: [x]  Yes  []  No    Call Received by Clinic Representative:  Kayla     If call not answered/not accepted, call received by Patient Services Representative:

## 2022-07-19 NOTE — Telephone Encounter
S/w pt who states she has questions about her birth control pills: Is there placebo pills in the pack and is she supposed to take them? Let pt know I would send a message to the clinical staff and Dr Stephan Minister.

## 2022-07-19 NOTE — Telephone Encounter
Spoke to patient and explained that her Micronor is progesterone only  and will take it continuously.  Patient was confused and happy instructions were given.

## 2022-07-24 DIAGNOSIS — N941 Unspecified dyspareunia: Secondary | ICD-10-CM

## 2022-07-24 DIAGNOSIS — R102 Pelvic and perineal pain: Secondary | ICD-10-CM

## 2022-07-25 ENCOUNTER — Inpatient Hospital Stay: Payer: BLUE CROSS/BLUE SHIELD

## 2022-07-25 MED ADMIN — GADOBUTROL 1 MMOL/ML IV SOLN: 7.5 mL | INTRAVENOUS | @ 01:00:00 | Stop: 2022-07-25 | NDC 50419032511

## 2022-08-12 ENCOUNTER — Telehealth: Payer: BLUE CROSS/BLUE SHIELD

## 2022-08-12 DIAGNOSIS — L290 Pruritus ani: Secondary | ICD-10-CM

## 2022-08-13 MED ORDER — HYDROCORTISONE 1 % EX CREA
1 refills | Status: AC
Start: 2022-08-13 — End: ?

## 2022-08-13 NOTE — Progress Notes
[facility]  [facility]      Date of Encounter: 08/12/2022    Megan Caldwell DOB: 12-Apr-1996  (27 y.o.) MR: [id number]      SUBJECTIVE     Megan Caldwell is a 27 y.o. G0 who comes in today for VIDEO VISIT follow-up & MRI review.  Changed to POPs from OCPs, no SE's, but still having urge symptoms. Inconsistent day-to-day.  Has constant sense of urinary urgency all of the time.      Has f/u with Dr Kelli Churn on 29/16/23.    Going to pelvic PT more consistently.     Using coconut-based testosterone/estrogen cream for about 29 weeks so far.  Does feel like she is making progress compared to 03/2022.     Treated for SIBO since last week, completed about 1 month ago.  Has felt less bloated since then.      Having perianal itching, especially at night, for a few months.  Non-tender, maybe small stinging with wiping sometimes.  Denies soiling/diarrhea.  Tried petroleum jelly, no improvement.       07/24/22 MRI Endo protocol:   FINDINGS:  Quality: Excellent.  ANTERIOR COMPARTMENT:  Urinary bladder: Normal.  Anterior cul-de-sac (vesicouterine space) obliteration: None.  Right ureter: Normal course and caliber.  Left ureter: Normal course and caliber.  Vesicocervical/vesicovaginal septum: Normal.  Prevesical space: Normal.   Urethra: Normal.   MIDDLE COMPARTMENT:  Uterus: Anteflexed, leftward  Morphology: Normal.  Size: 9 x 3.4 x 3.9 cm (longitudinal x AP x TR; 6001-15 and 8001-11).  Fibroids: None.  Junctional zone: Normal.  Endometrium: Normal thickness. No focal lesions.  Retrocervical complex: Normal.  Rectosigmoid colon: Not involved.  Posterior cul-de-sac obliteration: None.  Round ligaments: Normal bilaterally.  Vagina: Normal.  Right ovary:  Location: Conventional  Appearance: Contains an incidental dominant follicle.  Fallopian tube: Nondilated  Left ovary:  Location: Conventional  Appearance: Normal.  Fallopian tube: Nondilated  POSTERIOR COMPARTMENT:  Rectovaginal septum: Normal.  Sigmoid colon: Not involved.  LOWER ABDOMEN/OTHER:  Terminal ileum/cecum: Normal.  Appendix: Normal.  Other sites of disease: None.  ADDITIONAL FINDINGS:  Free fluid: Small volume free fluid, likely physiologic.  Lymph nodes: Unremarkable.  Abdominal wall: Unremarkable.  Bones: Unremarkable.   IMPRESSION:  1. No MR evidence of deep endometriosis of the pelvis.  2. Adnexa: Normal with nondilated fallopian tubes.      06/24/22 Initial HPI:   Pelvic pain consultation, referred by Kathrene Bongo, DPT ([facility]).      Menarche 27yo.  Menses irregular on Megan Caldwell, 3 days, heavy 2 days, at heaviest soaks in 4+ hours.      - AUB: started Megan Caldwell since 2018, tried DMPA in 2019 changed back for convenience. Prior OCP, bleeding multiple times per month, significantly heavier also.  Dysmenorrhea improved with improvement in volume of flow.     - Urinary urgency/frequency: painful since 01/2022, treated empirically for UTI multiple times without improvement.  Started pelvic PT in 04/2022, which has helped significantly.  Also followed by Dr Kelli Churn.      - Hormone-provoked vestibulodynia: tried topical estrogen for about 1 month (no burning, had some improvement).  Changed to estrogen/testosterone cream (VersaBase, has some burning) about 1-2 weeks ago, using every other day.     - Dyspareunia: almost always painful, insertion and deep, worst with insertion.  Had 03/2022 Abdominal/TVUS and was too painful to complete.  Has never been able to use a tampon.     - IBS/GERD:  endoscopy 2020, normal; followed by GI (Dr Sharion Balloon), has SIBO testing today. Tried Linzess, GI recommended supplementation instead, so trying now.     Last colonoscopy: never    FHx: endometriosis (mother)    PMH: none  PSH: wisdom teeth    ROS / PMH     Brief ROS: General ROS: negative for - malaise, fatigue, fever, chills, night sweats, sleep disturbance, weight gain and weight loss  Psychological ROS: negative for - anxiety, concentration difficulties, depression, hallucinations, irritability, mood swings, obsessive thoughts, physical abuse, sexual abuse, sleep disturbances and suicidal ideation  Breast ROS: negative for - new or changing breast lumps, mastalgia, engorgement, nipple discharge, nipple bleeding and galactorrhea  Respiratory ROS: no cough, shortness of breath, or wheezing  Cardiovascular ROS: no chest pain, palpitations, or dyspnea on exertion  Gastrointestinal ROS: no nausea, vomiting, abdominal pain, change in bowel habits, black or bloody stools  Genito-Urinary ROS: urinary frequency/urgency; no dysuria or hematuria  Gynecologic ROS: pelvic pain, dyspareunia    I reviewed the patient's PMH, FamHx, SocHx, Medications and Allergies.  - She  has a past medical history of Anxiety, Arthritis, Chicken pox, Common migraine, GERD (gastroesophageal reflux disease), Headache, Irritable bowel syndrome, and Seasonal allergies.  - She  has no past surgical history on file.  - Her family history includes Alcohol abuse in her father; Arthritis in her maternal grandmother; Diabetes in her maternal grandfather and maternal grandmother; Drug abuse in her father; Hearing loss in her mother; Hypertension in her maternal grandfather, maternal grandmother, and mother; Sudden death in her cousin.  - Allergies: Patient has no known allergies.    OBJECTIVE     EXAM:  GENERAL: Alert, well appearing.  VITALS: There were no vitals taken for this visit.  MENTAL STATUS: Alert, O x 3.  VIDEO VISIT    Previously:  HEENT: Benign; Eyes. Ears, Nose Mouth all normal.  SKIN: Skin generally normal color, consistency, tone.  EXTREMITIES: All normal.  LUNGS: Nonlabored.  PELVIC: Deferred today.    LABS:  Results for orders placed or performed in visit on 06/03/22   COVID-19  PCR/TMA, Nasopharyngeal    Specimen: Nasopharyngeal; Respiratory, Upper   Result Value Ref Range    Specimen Type Respiratory, Upper     COVID-19 PCR/TMA Not Detected Not Detected     Comment: A Not Detected (negative) test result does not preclude 2019-nCoV infection and should not be used as the sole basis for treatment or other patient management decisions. Not Detected (negative) results must be combined with clinical observations, patient history, and epidemiological information.     Influenza A/B RSV PCR, Respiratory Upper    Specimen: Nasopharyngeal; Respiratory, Upper   Result Value Ref Range    Influenza A PCR Not Detected Not Detected    Influenza B PCR Not Detected Not Detected    RSV PCR Not Detected Not Detected   POCT rapid strep A   Result Value Ref Range    Rapid Strep A Screen, Manual Negative Negative        ASSESS / PLAN     Diagnoses and all orders for this visit:    Pelvic pain    Perianal itch  -     hydrocortisone 1% cream; Apply to affected areas twice daily as needed.Marland Kitchen    Dyspareunia in female    Urinary urgency        Discussed history and symptoms.  Reviewed MRI endo protocol - no findings c/w endo.     Tolerating POP,  seems to be making early progress with hormone-provoked vestibulodynia.  Continue testosterone/estrogen cream for now.     Continue with Dr Kelli Churn regarding bladder pressure, has f/u upcoming.     Disc perianal itching - Rx hydrocortisone cream, use no longer than 2 weeks.      Will do physical exam at follow-up.      Return in about 1 month (around 09/12/2022).    35 minutes were spent personally by me today on this encounter which include today's pre-visit review of the chart, obtaining appropriate history, performing an evaluation, documentation and discussion of management with details supported within the note for today's visit. The time documented was exclusive of any time spent on the separately billed procedure.      Iantha Fallen. Gregrey Bloyd, DO, MPH, FACOG was exclusive of any time spent on the separately billed procedure.      Iantha Fallen. Stephan Minister DO, MPH, FACOG

## 2022-08-13 NOTE — Telephone Encounter
PDL Call to Clinic    Reason for Call:Pt is calling to get an update on Dr. Macon Large. He has not joined 4:15PM video visit.     Appointment Related?  [x]  Yes  []  No     If yes;  Date:1/5   Time:4:15PM     Call warm transferred to PDL: []  Yes  [x]  No    Call Received by Clinic Representative:    If call not answered/not accepted, call received by Patient Services Representative:  No long wait

## 2022-08-14 DIAGNOSIS — R102 Pelvic and perineal pain: Secondary | ICD-10-CM

## 2022-08-14 DIAGNOSIS — R3915 Urgency of urination: Secondary | ICD-10-CM

## 2022-08-14 DIAGNOSIS — N941 Unspecified dyspareunia: Secondary | ICD-10-CM

## 2022-08-14 NOTE — Telephone Encounter
Video visit completed.

## 2022-08-15 ENCOUNTER — Telehealth: Payer: BLUE CROSS/BLUE SHIELD

## 2022-08-15 ENCOUNTER — Ambulatory Visit: Payer: BLUE CROSS/BLUE SHIELD

## 2022-08-15 NOTE — Telephone Encounter
Called and left a VM letting pt know that she was tentatively scheduled for 1 month in person f/u with Dr. Macon Large on 2/15 at 3pm.

## 2022-08-23 ENCOUNTER — Ambulatory Visit: Payer: BLUE CROSS/BLUE SHIELD

## 2022-08-23 DIAGNOSIS — N9489 Other specified conditions associated with female genital organs and menstrual cycle: Secondary | ICD-10-CM

## 2022-08-23 DIAGNOSIS — L290 Pruritus ani: Secondary | ICD-10-CM

## 2022-08-23 DIAGNOSIS — N94819 Vulvodynia, unspecified: Secondary | ICD-10-CM

## 2022-08-23 DIAGNOSIS — M7918 Myalgia, other site: Secondary | ICD-10-CM

## 2022-08-24 NOTE — Progress Notes
[facility] for [facility] Pelvic Health  Urogynecology Consultation Note  PATIENT: Megan Caldwell  MRN: [id number]  DOB: [birth year]  DATE OF SERVICE: 08/23/2022    REFERRING PRACTITIONER: No ref. provider found  REASON FOR REFERRAL: Follow-up     Subjective:     Chief Complaint:  Megan Caldwell is a 27 y.o. female referred for urinary urgency.     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 Stephan Minister, was using estrogen/testosterone cream - switched to coconut oil base with improvement.  Dr. Stephan Minister switched her from COCs to POPs. Since switching she notices a significant difference in mood. She is having intrusive thoughts and depression.   Overall she feels significant improvement in her symptoms and is happy with the progress she is making. The bladder pressure is improved.       Initial History: 05/24/22:  MBS:  Urinary frequency  Bother: 8/10    Urinary urgency which started in June. She has been treated several times for UTIs with negative cultures. She had BV. She has severe pain with pelvic exams. 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 August and September were the worse, hard to walk. Now things are better since starting PFPT.   Symptoms are worse when she is walking.  Nocturia 3 times   Extreme urge to urinate.    Currently she is going to PFPT weekly, Dr. Chalmers Cater  Voiding q23min-1hour   Been doing bladder training   Also not able to tolerate pelvic exams   Not able to be sexually active due to pain, not currently sexually active.     Started wearing incontinence pads JIC     Some bloating, previously having constipation and pain.     BMs are normal now because she is on linzess   Intermittent Abdominal Pain    On OCPs x 10 years   Amenorrheic currently    (click to expand/collapse)   Questionnaire responses: Myc Uro Pelvic Floor Distress Inventory    05/24/2022 12:20 PM PDT - Ceasar Mons by Patient   Do you usually experience pressure in the lower abdomen? Yes   If Yes, how much does it bother you? Moderately   Do you usually experience heaviness or dullness in the lower abdomen? Yes   If Yes, how much does it bother you? Moderately   Do you usually have a bulge or something falling out that you can see or feel in the vaginal area? No   Do you usually have to push on the vagina or around the rectum to have a complete bowel movement? No   Do you usually experience a feeling of incomplete bladder emptying? Yes   If Yes, how much does it bother you? Somewhat   Do you ever have to push up in the vaginal area with your fingers to start or complete urination? No   Do you feel you need to strain too hard to have a bowel movement? Yes   If Yes, how much does it bother you? Somewhat   Do you feel you have not completely emptied your bowels at the end of a bowel movement? Yes   If Yes, how much does it bother you? Moderately   Do you usually lose stool beyond your control if your stool is well formed? No  Do you usually lose stool beyond your control if your stool is loose or liquid? No   Do you usually lose gas from the rectum beyond your control? No   Do you usually have pain when you pass your stool? Yes   If Yes, how much does it bother you? Somewhat   Do you experience a strong sense of urgency and have to rush to the bathroom to have a bowel movement? No   Does part of your bowel ever pass through the rectum and bulge outside during or after a bowel movement? No   Do you usually experience frequent urination? Yes   If Yes, how much does it bother you? Quite a bit   Do you usually experience urine leakage associated with a feeling of urgency: that is, a strong sensation of needing to go to the bathroom? Yes   If Yes, how much does it bother you? Quite a bit   Do you usually experience urine leakage related to laughing, coughing, or sneezing? No   Do you usually experience small amounts of urine leakage (that is, drops)? No   Do you usually experience difficulty emptying your bladder? Yes   If Yes, how much does it bother you? Moderately   Do you usually experience pain or discomfort in the lower abdomen or genital region? Yes   If Yes, how much does it bother you? Quite a bit   PFDI - POPDI (range: 0 - 24) 8   PFDI CRADI SCORE: (range: 0 - 32) 7   PFDI UDI Score: (range: 0 - 24) 15       Myc Uro-Iciq Fluts    05/24/2022 12:20 PM PDT - Filed by Patient   During the night, how many times do you have to get up to urinate, on average? Three   Which number best describes your AVERAGE pain or discomfort on the days you had it, over the last week? 6   Do you have a sudden need to rush to the toilet to urinate? Most of the time   How much does this bother you?  7   Do you have pain in your bladder? Sometimes   How much does this bother you? 7   How often do you pass urine during the day? Most of the time   How much does this bother you? 7   Is there a delay before you can start to urinate? Occasionally   How much does this bother you? 5   Do you have to strain to urinate? Occasionally   How much does this bother you? 5   Do you stop and start more than once while you urinate? Occasionally   How much does this bother you? 5   Does urine leak before you can get to the toilet? Occasionally   How much does this bother you? 5   How often do you leak urine? Occasionally   How much does this bother you? 5   Does urine leak when you are physically active, exert yourself, cough or sneeze? Sometimes   How much does this bother you? 7   Do you ever leak urine for no obvious reason and without feeling that you want to go? Never   How much does this bother you?    Do you leak urine when you are asleep? Never   How much does this bother you?    Female Urinary Symptoms - Filling (range: 0 - 16) 11   Female Urinary  Symptoms - Voiding (range: 0 - 12) 3 Female Urinary Symptoms - Incontinence (range: 0 - 20) 4   Female Urinary Symptoms - Total Score (range: 0 - 48) 18       Myc Uro Female Genitourinary Pain Index (Fgupi)    05/24/2022 12:20 PM PDT - Filed by Patient   In the last week have you experienced any pain or discomfort in the following areas?    Entrance to vagina  yes    Vagina No   Urethra Yes    Below your waist, in your pubic or bladder area.  Yes   In the last week have you experienced:    Pain or burning during urination? Yes    Pain or discomfort during or after sexual intercourse? No   Pain or discomfort as your bladder fills? Yes    Pain or discomfort relieved by voiding?  Yes   How often have you had pain or discomfort in any of these areas over the last week?  sometimes   Which number best describes your AVERAGE pain or discomfort on the days you had it, over the last week? (range: 0 [No Pain] - 10 [Pain as bad as you can imagine]) 6    How often have you had a sensation of not emptying your bladder completely after you finished urinating, over the last week? Less than half the time   How often have you had to urinate again less than two hours after you finished urinating, over the last week?  almost always   How much have your symptoms kept you from doing the kinds of things you would usually do, over the last week?  A lot   How much did you think about your symptoms, over the last week? a lot   If you were to spend the rest of your life with your symptoms just the way they have been during the last week, how would you feel about that? Unhappy   FGUPI Pain Score: (range: 0 - 23) 14   FGUPI Urinary Score: (range: 0 - 10) 7   FGUPI QOL Score: (range: 0 - 12) 11         Past Medical History:   Diagnosis Date    Anxiety     In feet (dancer)    Arthritis     Chicken pox     Had when I was a kid    Common migraine     GERD (gastroesophageal reflux disease)     Headache     Irritable bowel syndrome     Seasonal allergies      No past surgical history on file.  Family History   Problem Relation Age of Onset    Hearing loss Mother     Hypertension Mother     Alcohol abuse Father     Drug abuse Father     Arthritis Maternal Grandmother     Diabetes Maternal Grandmother     Hypertension Maternal Grandmother     Diabetes Maternal Grandfather     Hypertension Maternal Grandfather     Sudden death Cousin         1st cousin once removed     OB History   No obstetric history on file.     No Known Allergies  Current Outpatient Medications   Medication Sig    ALPRAZolam 0.5 mg tablet Take 1 tablet (0.5 mg total) by mouth as needed for.    busPIRone 5 mg tablet Take  1 tablet (5 mg total) by mouth two (2) times daily.    Cetirizine HCl (ZYRTEC ALLERGY PO) Take by mouth as needed for.    compounding medication Estradiol 0.03%/testosterone 0/15% in verbase. Apply 0.5g to vaginal opening every other night.. (Patient taking differently: Estradiol 0.03%/testosterone 0/15% in verbase. Apply 0.5g to vaginal opening every other night.Marland Kitchen)    HAILEY 24 FE 1-20 MG-MCG(24) tablet daily.    hydrocortisone 1% cream Apply to affected areas twice daily as needed..    norethindrone (MICRONOR) 0.35 mg tablet Take 1 tablet (0.35 mg total) by mouth daily.    compounding medication Amitriptyline 2%/baclofen 2% Apply 1 click to vulva at bedtime with gradual increase up tot 3 times a day... (Patient not taking: Reported on 06/09/2022.)    estradiol (ESTRACE) 0.1 mg/g vaginal cream Apply 0.5g (pea size amount) nightly for two weeks followed by twice per week thereafter.. (Patient not taking: Reported on 06/24/2022.)    LINZESS 72 MCG capsule Take 1 capsule (72 mcg total) by mouth daily. (Patient not taking: Reported on 06/24/2022.)    phenazopyridine 200 mg tablet Take 1 tablet (200 mg total) by mouth three (3) times daily as needed. (Patient not taking: Reported on 05/12/2022.)     No current facility-administered medications for this visit.       Social History:   Social History     Socioeconomic History    Marital status: Single   Tobacco Use    Smoking status: Never    Smokeless tobacco: Never   Substance and Sexual Activity    Alcohol use: Yes     Alcohol/week: 0.6 oz     Types: 1 Glasses of Wine (5 oz) per week     Comment: Socially    Drug use: Never    Sexual activity: Not Currently     Partners: Male     Birth control/protection: Oral Contraceptive Pill   Other Topics Concern    Do you exercise at least a day, 3 or more days a week? Yes    Types of Exercise? (List in Comments) Yes     Comment: Basic gym cardio & dance classes    Do you follow a special diet? No    Vegan? No    Vegetarian? No    Pescatarian? No    Lactose Free? No    Gluten Free? No    Omnivore? No       Objective:     Physical Exam:      Vitals:  BP 126/76  ~ Pulse (!) 104  ~ Ht 5' 3'' (1.6 m)  ~ Wt 105 lb (47.6 kg)  ~ BMI 18.60 kg/m?    General:   Vital woman, appears her stated age, no apparent distress   Back:  No costovertebral angle tenderness   Abdomen:   Soft and nondistended. No hepatosplenomegaly. No rectus diastasis. Nontender                         (click to expand/collapse)   Lab Review:    Lab Results   Component Value Date    BACULUR Contamination suspected; Submit new specimen 04/17/2022       Lab Results   Component Value Date    WBC 3.86 (L) 05/04/2022    HGB 14.6 05/04/2022    HCT 45.9 (H) 05/04/2022    MCV 93.5 05/04/2022    PLT 277 05/04/2022     Lab Results   Component  Value Date    BACULUR Contamination suspected; Submit new specimen 04/17/2022     Lab Results   Component Value Date    CREAT 0.83 05/04/2022    BUN 9 05/04/2022    NA 140 05/04/2022    K 4.7 05/04/2022    CL 103 05/04/2022    CO2 26 05/04/2022    ALT 16 05/04/2022    AST 32 05/04/2022    ALKPHOS 53 05/04/2022    BILITOT 0.2 05/04/2022    ALBUMIN 4.4 05/04/2022     No results found for: Christell Faith''      Imaging:  07/24/22 MRI Endo protocol:   FINDINGS:  Quality: Excellent.  ANTERIOR COMPARTMENT:  Urinary bladder: Normal.  Anterior cul-de-sac (vesicouterine space) obliteration: None.  Right ureter: Normal course and caliber.  Left ureter: Normal course and caliber.  Vesicocervical/vesicovaginal septum: Normal.  Prevesical space: Normal.   Urethra: Normal.   MIDDLE COMPARTMENT:  Uterus: Anteflexed, leftward  Morphology: Normal.  Size: 9 x 3.4 x 3.9 cm (longitudinal x AP x TR; 6001-15 and 8001-11).  Fibroids: None.  Junctional zone: Normal.  Endometrium: Normal thickness. No focal lesions.  Retrocervical complex: Normal.  Rectosigmoid colon: Not involved.  Posterior cul-de-sac obliteration: None.  Round ligaments: Normal bilaterally.  Vagina: Normal.  Right ovary:  Location: Conventional  Appearance: Contains an incidental dominant follicle.  Fallopian tube: Nondilated  Left ovary:  Location: Conventional  Appearance: Normal.  Fallopian tube: Nondilated  POSTERIOR COMPARTMENT:  Rectovaginal septum: Normal.  Sigmoid colon: Not involved.  LOWER ABDOMEN/OTHER:  Terminal ileum/cecum: Normal.  Appendix: Normal.  Other sites of disease: None.  ADDITIONAL FINDINGS:  Free fluid: Small volume free fluid, likely physiologic.  Lymph nodes: Unremarkable.  Abdominal wall: Unremarkable.  Bones: Unremarkable.   IMPRESSION:  1. No MR evidence of deep endometriosis of the pelvis.  2. Adnexa: Normal with nondilated fallopian tubes.              Assessment / Plan :     Impression:  In brief, Megan Caldwell is a 27 y.o. 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   Encounter Diagnoses   Name Primary?    High-tone pelvic floor dysfunction Yes    Myofascial pain     Anal itching     Vulvodynia          Plan:    # Myofascial frequency syndrome/HTPFD: with low pelvic discomfort and pressure as well as urinary frequency likely related to pelvic floor dysfunction. Pain unrelated to urination and symptoms elicited with palpation of hypertonic pelvic floor muscles. We had an extensive discussion, including the use of a pelvic floor model, regarding the origins and nature of the condition, including factors that may contribute to flare ups of symptoms. We discussed the treatment options for the management of this condition, for which pelvic floor physical therapy is the gold standard. We also discussed the use of adjunctive medications, such as vaginal muscle relaxants like vaginal valium, systemic muscle relaxants (like robaxin or cyclobenzaprine), nonmedicinal relaxation techniques, like hot baths and warm compresses, as well as lifestyle modifications, including stress management techniques, proper sleep hygiene, stretching and activity breaks for sedentary jobs, and ergonomically-friendly workplace and regular low-impact exercise. She asked many questions which were answered to her satisfaction, and she would like to proceed with physical therapy. Overall with significant improvement with PFPT.  Would like to continue forward on this path.   - Continue with PFPT   -  Will follow up with Dr. Chalmers Cater     # Vestibulodynia   - Compounded estrogen/testosterone cream    # Perianal itching   - Normal skin without signs of infection or irritation. No improvement with hydrocortisone.   - Recommend follow up with PCP to discuss possible pinworm     # Atrophy   - Vaginal estrogen prescribed     RTC in 3 months     I personally spent a total of 35 minutes today which includes face-to-face time and non-face-to-face time spent on preparing to see the patient, reviewing prior notes and tests, obtaining history from the patient, performing a medically appropriate exam, counseling and educating the patient, ordering medications/tests/procedures/referrals as clinically indicated, and documenting information in the electronic medical record.      Donell Beers, MD 08/23/2022  Urogynecology

## 2022-08-24 NOTE — Patient Instructions
PHYSICAL THERAPISTS FOR PELVIC FLOOR PHYSICAL THERAPY   Location by [employer] Phone Number   [facility] [place]) [address] Chalmers Guest [place], [place] [address] [phone]   [facility] ([place] location) [address] [place], [place], [place] [address] [phone]   [facility] Vito Berger) [address] [place] [address], [place], [place] [address] [phone]   [facility]) [address], [place], [place] [address] [phone]   [facility]) [address], [place], [place] [address] [phone]   [facility] Center   [facility]) [address], [place], [place] [address] [phone]   [facility]  [address] [place], [place], [place] [address] [phone]   [facility]  [address] Theodoro Kalata [place], [place] [address] [phone] [address], [place], [place] [address]    [facility]) [address] [place], [place] [address] [phone]   [facility]   [facility] [address] Francene Castle [place], [place] [address] [phone]    [address] Vincent[address] [place], [place] [address] [phone]   [facility]   [address] Jewel Baize [place], [place] [address] [phone]   [facility] [address] Beryle Flock [place], [place] [address] [phone]   [facility]   [facility] Lahoma Rocker [place]) [address] [address] Olive Bass [place], [place] [address] [phone]   [facility] [address] Anastasia Pall [place], [place] [address] [phone]   [facility] [address], [place], [place] [address] [phone]   [facility] Therapy [address] [place], [place], [place] [address] [phone]   [facility] Physical Therapy [address] Ulanda Edison [place], [place] [address] [phone]   [facility]) [address] Phenix [place], [place] [address] [phone]   [facility]  [address] Kem Kays [place], [place] [address] [phone]   [facility] [place]   [facility] [address] [address], [place], [address] [phone]   [facility] Physical Therapy Pelvic Floor [facility] [address] Lonell Grandchild [place], [place] [address] [phone]   Dianna Rossetti? *unable to find address but was recommended by pt*    [facility]   [facility] - Pelvic Floor Therapy [place] [address] [place], [place], [place] [address] [phone]   [facility]   ([place] location) [address] [place], [place] [address] [phone]   [facility] [address] [place], [place], [place] [address] [phone]   [facility] Physical Therapy [address] Hettie Holstein [place], [place] [address] [phone]   [facility] [address], [place], [place] [address] [phone]   [facility]  [address], [place], [place] [address] [phone]   Rolin Barry. [address] PT [address], [place], [place] [address] [phone]   [facility] Ethlyn [facility] [address] Allie [place], [place] [address] [phone]   [facility] Stark Klein  [facility]) [address], [place], [place] [address] [phone]   [facility] Jetty Peeks) [address], [place], [place] [address] [phone]   [facility] location) [address], [place], [place] [address] [phone]   [facility] Antony Contras [place]) [address], [place], [place] [address] [phone]     [facility] ([place] location) [address], [place], [place] [address] [phone]     [facility] Therapy [address] Ignacia Palma [place], [place] [address] [phone]   [facility] [address] Shellee Milo [place], [place] [address] [phone]   [facility] - [facility] [address] Samuella Cota [place], [place] [address] [phone]   [facility]   [facility] [address] [place], [place] [address] [phone]   [facility]  [address], [place], [place] [address] [phone]   [facility] Therapy [address] Woodward [place], [place] [address] [phone]   [facility]   [facility] [address] Kathrynn Speed [place], [place] [address] [phone]   [facility]) [address] Chrisandra [place], [place] [address] [phone]    [address] Eula Flax [place], [place] [address] [phone]   [facility] Physical Therapy [address] Cannon Kettle [place], [place] [address] [phone]    [address], [place], [place] [address] [phone]    [address] Jacqlyn Krauss [place], [place] [address] [phone]   [facility]   [facility] [address] Dian Situ [place], [place] [address] [phone]   [facility]AL [facility]   [facility] [address], [place], [place] [address] [phone]   [facility]PT Physical therapy Multiple locations in [place], [place], [place], [place], [place]  [phone]

## 2022-08-30 ENCOUNTER — Ambulatory Visit: Payer: BLUE CROSS/BLUE SHIELD

## 2022-08-30 DIAGNOSIS — L290 Pruritus ani: Secondary | ICD-10-CM

## 2022-08-31 ENCOUNTER — Telehealth: Payer: BLUE CROSS/BLUE SHIELD

## 2022-08-31 NOTE — Telephone Encounter
PDL Call to Clinic    Reason for Call:Patient would like to speak to Beverlee Nims about her side effects    Appointment Related?  [x]  Yes  []  No     If yes;  Date:2/15  Time:3pm    Call warm transferred to PDL: [x]  Yes  []  No    Call Received by Clinic Representative:    Kayla    If call not answered/not accepted, call received by Patient Services Representative:

## 2022-08-31 NOTE — Telephone Encounter
S/w pt and scheduled w Dr Macon Large for tomorrow, 1/25 at 3:15pm. Pt accepted appt and confirmed details. Per patient, does not need VV on 2/15 so I cancelled it.

## 2022-09-01 ENCOUNTER — Ambulatory Visit: Payer: BLUE CROSS/BLUE SHIELD

## 2022-09-01 DIAGNOSIS — R3915 Urgency of urination: Secondary | ICD-10-CM

## 2022-09-01 DIAGNOSIS — R102 Pelvic and perineal pain: Secondary | ICD-10-CM

## 2022-09-01 DIAGNOSIS — Z3041 Encounter for surveillance of contraceptive pills: Secondary | ICD-10-CM

## 2022-09-01 NOTE — Progress Notes
[facility]  [facility]      Date of Encounter: 09/01/2022    Megan Caldwell DOB: January 19, 1996  (27 y.o.) MR: [id number]      SUBJECTIVE     Megan Caldwell is a 27 y.o. G0 who comes in today for follow-up.  Started POP in 06/2022, was feeling mostly ok at last visit, but didn't recognize severity until afterward.  Anxiety became much worse, so 1.5 weeks back on old combo OCP and starting to feel like herself again.     Saw Dr Kelli Churn for follow-up.     Continuing with pelvic PT, having good progress, had some external pain with treatment yesterday.  Using combo cream twice weekly, does think helping.       08/12/22 HPI:   VIDEO VISIT follow-up & MRI review.  Changed to POPs from OCPs, no SE's, but still having urge symptoms. Inconsistent day-to-day.  Has constant sense of urinary urgency all of the time.      Has f/u with Dr Kelli Churn on 08/23/21.    Going to pelvic PT more consistently.     Using coconut-based testosterone/estrogen cream for about 3 weeks so far.  Does feel like she is making progress compared to 03/2022.     Treated for SIBO since last week, completed about 1 month ago.  Has felt less bloated since then.      Having perianal itching, especially at night, for a few months.  Non-tender, maybe small stinging with wiping sometimes.  Denies soiling/diarrhea.  Tried petroleum jelly, no improvement.       07/24/22 MRI Endo protocol:   FINDINGS:  Quality: Excellent.  ANTERIOR COMPARTMENT:  Urinary bladder: Normal.  Anterior cul-de-sac (vesicouterine space) obliteration: None.  Right ureter: Normal course and caliber.  Left ureter: Normal course and caliber.  Vesicocervical/vesicovaginal septum: Normal.  Prevesical space: Normal.   Urethra: Normal.   MIDDLE COMPARTMENT:  Uterus: Anteflexed, leftward  Morphology: Normal.  Size: 9 x 3.4 x 3.9 cm (longitudinal x AP x TR; 6001-15 and 8001-11).  Fibroids: None.  Junctional zone: Normal.  Endometrium: Normal thickness. No focal lesions.  Retrocervical complex: Normal.  Rectosigmoid colon: Not involved.  Posterior cul-de-sac obliteration: None.  Round ligaments: Normal bilaterally.  Vagina: Normal.  Right ovary:  Location: Conventional  Appearance: Contains an incidental dominant follicle.  Fallopian tube: Nondilated  Left ovary:  Location: Conventional  Appearance: Normal.  Fallopian tube: Nondilated  POSTERIOR COMPARTMENT:  Rectovaginal septum: Normal.  Sigmoid colon: Not involved.  LOWER ABDOMEN/OTHER:  Terminal ileum/cecum: Normal.  Appendix: Normal.  Other sites of disease: None.  ADDITIONAL FINDINGS:  Free fluid: Small volume free fluid, likely physiologic.  Lymph nodes: Unremarkable.  Abdominal wall: Unremarkable.  Bones: Unremarkable.   IMPRESSION:  1. No MR evidence of deep endometriosis of the pelvis.  2. Adnexa: Normal with nondilated fallopian tubes.      06/24/22 Initial HPI:   Pelvic pain consultation, referred by Kathrene Bongo, DPT ([facility]).      Menarche 27yo.  Menses irregular on Megan Caldwell, 3 days, heavy 2 days, at heaviest soaks in 4+ hours.      - AUB: started Megan Caldwell since 2018, tried DMPA in 2019 changed back for convenience. Prior OCP, bleeding multiple times per month, significantly heavier also.  Dysmenorrhea improved with improvement in volume of flow.     - Urinary urgency/frequency: painful since 01/2022, treated empirically for UTI multiple times without improvement.  Started pelvic PT in 04/2022, which has  helped significantly.  Also followed by Dr Kelli Churn.      - Hormone-provoked vestibulodynia: tried topical estrogen for about 1 month (no burning, had some improvement).  Changed to estrogen/testosterone cream ([employer], has some burning) about 1-2 weeks ago, using every other day.     - Dyspareunia: almost always painful, insertion and deep, worst with insertion.  Had 03/2022 Abdominal/TVUS and was too painful to complete.  Has never been able to use a tampon.     - IBS/GERD: endoscopy 2020, normal; followed by GI (Dr Sharion Balloon), has SIBO testing today. Tried Linzess, GI recommended supplementation instead, so trying now.     Last colonoscopy: never    FHx: endometriosis (mother)    PMH: none  PSH: wisdom teeth    ROS / PMH     Brief ROS: General ROS: negative for - malaise, fatigue, fever, chills, night sweats, sleep disturbance, weight gain and weight loss  Psychological ROS: negative for - anxiety, concentration difficulties, depression, hallucinations, irritability, mood swings, obsessive thoughts, physical abuse, sexual abuse, sleep disturbances and suicidal ideation  Breast ROS: negative for - new or changing breast lumps, mastalgia, engorgement, nipple discharge, nipple bleeding and galactorrhea  Respiratory ROS: no cough, shortness of breath, or wheezing  Cardiovascular ROS: no chest pain, palpitations, or dyspnea on exertion  Gastrointestinal ROS: no nausea, vomiting, abdominal pain, change in bowel habits, black or bloody stools  Genito-Urinary ROS: urinary frequency/urgency; no dysuria or hematuria  Gynecologic ROS: pelvic pain, dyspareunia    I reviewed the patient's PMH, FamHx, SocHx, Medications and Allergies.  - She  has a past medical history of Anxiety, Arthritis, Chicken pox, Common migraine, GERD (gastroesophageal reflux disease), Headache, Irritable bowel syndrome, and Seasonal allergies.  - She  has no past surgical history on file.  - Her family history includes Alcohol abuse in her father; Arthritis in her maternal grandmother; Diabetes in her maternal grandfather and maternal grandmother; Drug abuse in her father; Hearing loss in her mother; Hypertension in her maternal grandfather, maternal grandmother, and mother; Sudden death in her cousin.  - Allergies: Patient has no known allergies.    OBJECTIVE     EXAM:  GENERAL: Alert, well appearing.  VITALS: BP 119/81  ~ Pulse (!) 102  ~ Wt 114 lb 9.6 oz (52 kg)  ~ BMI 20.30 kg/m?   MENTAL STATUS: Alert, O x 3.  HEENT: Benign; Eyes. Ears, Nose Mouth all normal.  SKIN: Skin generally normal color, consistency, tone.  EXTREMITIES: All normal.  LUNGS: Nonlabored.  PELVIC: Deferred today.    LABS:  Results for orders placed or performed in visit on 06/03/22   COVID-19  PCR/TMA, Nasopharyngeal    Specimen: Nasopharyngeal; Respiratory, Upper   Result Value Ref Range    Specimen Type Respiratory, Upper     COVID-19 PCR/TMA Not Detected Not Detected     Comment: A Not Detected (negative) test result does not preclude 2019-nCoV infection and should not be used as the sole basis for treatment or other patient management decisions. Not Detected (negative) results must be combined with clinical observations, patient history, and epidemiological information.     Influenza A/B RSV PCR, Respiratory Upper    Specimen: Nasopharyngeal; Respiratory, Upper   Result Value Ref Range    Influenza A PCR Not Detected Not Detected    Influenza B PCR Not Detected Not Detected    RSV PCR Not Detected Not Detected   POCT rapid strep A   Result Value Ref Range    Rapid  Strep A Screen, Manual Negative Negative        ASSESS / PLAN     Diagnoses and all orders for this visit:    Encounter for surveillance of contraceptive pills  -     HAILEY 24 FE 1-20 MG-MCG(24) tablet; Take 1 tablet by mouth daily.    Pelvic pain    Urinary urgency      Discussed history and symptoms.  Agree with cessation of POP given response, feeling better back on Hailey 1/20.     Seeing improvement with compound testosterone/estrogen cream and pelvic PT.   If hormone-provoked vestibulodynia symptoms worsen back on combo OCP, will try LoLoestrin.     Previously reviewed MRI endo protocol - no findings c/w endo.      Continue with Dr Kelli Churn regarding bladder pressure.     Still needs physical exam.      Return in about 2 months (around 10/31/2022).    20 minutes were spent personally by me today on this encounter which include today's pre-visit review of the chart, obtaining appropriate history, performing an evaluation, documentation and discussion of management with details supported within the note for today's visit. The time documented was exclusive of any time spent on the separately billed procedure.      Iantha Fallen. Stephan Minister DO, MPH, FACOG

## 2022-09-02 MED ORDER — HAILEY 24 FE 1-20 MG-MCG(24) PO TABS
1 | Freq: Every day | ORAL | 3 refills | Status: AC
Start: 2022-09-02 — End: ?

## 2022-09-09 ENCOUNTER — Ambulatory Visit: Payer: BLUE CROSS/BLUE SHIELD

## 2022-09-13 ENCOUNTER — Ambulatory Visit: Payer: BLUE CROSS/BLUE SHIELD | Attending: Student in an Organized Health Care Education/Training Program

## 2022-09-13 DIAGNOSIS — R350 Frequency of micturition: Secondary | ICD-10-CM

## 2022-09-13 DIAGNOSIS — N390 Urinary tract infection, site not specified: Secondary | ICD-10-CM

## 2022-09-13 DIAGNOSIS — R300 Dysuria: Secondary | ICD-10-CM

## 2022-09-14 LAB — Trichomonas vaginalis Antigen: TRICHOMONAS VAGINALIS ANTIGEN: NEGATIVE

## 2022-09-14 LAB — Bacterial Vaginosis Screen

## 2022-09-14 LAB — UA,Microscopic: RBCS: 8 {cells}/uL (ref 0–11)

## 2022-09-14 LAB — UA,Dipstick: PH,URINE: 7 (ref 5.0–8.0)

## 2022-09-14 MED ORDER — PHENAZOPYRIDINE HCL 200 MG PO TABS
200 mg | ORAL_TABLET | Freq: Three times a day (TID) | ORAL | 0 refills | Status: AC
Start: 2022-09-14 — End: ?

## 2022-09-14 MED ORDER — NITROFURANTOIN MONOHYD MACRO 100 MG PO CAPS
100 mg | ORAL_CAPSULE | Freq: Two times a day (BID) | ORAL | 0 refills | Status: AC
Start: 2022-09-14 — End: ?

## 2022-09-14 MED ORDER — NITROFURANTOIN MONOHYD MACRO 100 MG PO CAPS
100 mg | ORAL_CAPSULE | Freq: Two times a day (BID) | ORAL | 0 refills | Status: AC
Start: 2022-09-14 — End: 2022-09-14

## 2022-09-14 NOTE — Progress Notes
IMMEDIATE CARE VISIT    [facility]    Date of service: 09/13/2022    Patient: Megan Caldwell  Date of birth: July 15, 1996  [place] MRN: [id number]  PCP: Laurina Bustle., MD     SUBJECTIVE:     Chief Complaint   Patient presents with    Urinary Tract Infection     X1 week        Patient complains of burning with urination.  She has had symptoms for 1 week.   Patient denies back pain, fever, or nausea.   Patient does not have a history of recurrent UTI.  Patient does not have a history of pyelonephritis.       Past Medical History:   Diagnosis Date    Anxiety     In feet (dancer)    Arthritis     Chicken pox     Had when I was a kid    Common migraine     GERD (gastroesophageal reflux disease)     Headache     Irritable bowel syndrome     Seasonal allergies      No past surgical history on file.  Outpatient Medications Prior to Visit   Medication Sig Dispense Refill    ALPRAZolam 0.5 mg tablet Take 1 tablet (0.5 mg total) by mouth as needed for.      busPIRone 5 mg tablet Take 1 tablet (5 mg total) by mouth two (2) times daily.      Cetirizine HCl (ZYRTEC ALLERGY PO) Take by mouth as needed for.      compounding medication Estradiol 0.03%/testosterone 0/15% in verbase. Apply 0.5g to vaginal opening every other night.. 30 g 3    HAILEY 24 FE 1-20 MG-MCG(24) tablet Take 1 tablet by mouth daily. 84 each 3    hydrocortisone 1% cream Apply to affected areas twice daily as needed.. 30 g 1    compounding medication Amitriptyline 2%/baclofen 2% Apply 1 click to vulva at bedtime with gradual increase up tot 3 times a day... (Patient not taking: Reported on 09/13/2022.) 30 g 2    estradiol (ESTRACE) 0.1 mg/g vaginal cream Apply 0.5g (pea size amount) nightly for two weeks followed by twice per week thereafter.. (Patient not taking: Reported on 06/24/2022.) 42.5 g 3    LINZESS 72 MCG capsule Take 1 capsule (72 mcg total) by mouth daily. (Patient not taking: Reported on 06/24/2022.) 30 capsule 5    norethindrone (MICRONOR) 0.35 mg tablet Take 1 tablet (0.35 mg total) by mouth daily. (Patient not taking: Reported on 09/13/2022.) 84 each 3    phenazopyridine 200 mg tablet Take 1 tablet (200 mg total) by mouth three (3) times daily as needed. (Patient not taking: Reported on 05/12/2022.)       No facility-administered medications prior to visit.     No Known Allergies  Family History   Problem Relation Age of Onset    Hearing loss Mother     Hypertension Mother     Alcohol abuse Father     Drug abuse Father     Arthritis Maternal Grandmother     Diabetes Maternal Grandmother     Hypertension Maternal Grandmother     Diabetes Maternal Grandfather     Hypertension Maternal Grandfather     Sudden death Cousin         1st cousin once removed     Social History     Socioeconomic History    Marital status: Single  Tobacco Use    Smoking status: Never    Smokeless tobacco: Never   Substance and Sexual Activity    Alcohol use: Yes     Alcohol/week: 0.6 oz     Types: 1 Glasses of Wine (5 oz) per week     Comment: Socially    Drug use: Never    Sexual activity: Not Currently     Partners: Male     Birth control/protection: Oral Contraceptive Pill   Other Topics Concern    Do you exercise at least a day, 3 or more days a week? Yes    Types of Exercise? (List in Comments) Yes     Comment: Basic gym cardio & dance classes    Do you follow a special diet? No    Vegan? No    Vegetarian? No    Pescatarian? No    Lactose Free? No    Gluten Free? No    Omnivore? No       ROS  Review of symptoms is negative other than noted in the HPI.      OBJECTIVE:   BP 133/92  ~ Pulse 87  ~ Temp 37 ?C (98.6 ?F) (Tympanic)  ~ Resp 18  ~ SpO2 99%  There is no height or weight on file to calculate BMI.  Constitutional:  No acute distress. Alert. Normal mental status. Answers all questions appropriately.  Eyes: Clear sclera and conjunctiva.  Neck: Supple.  Lungs: No respiratory distress. Clear to auscultation bilaterally.  Heart / Chest: Regular rate and rhythm.  Abdomen: Soft and non distended. No TTP in the abdomen. No rebound / guarding. No CVAT.  MSK / Extremities: Normal appearance.   Skin: Skin color and temperature normal. No visible rashes or lesions.   Neurological: Grossly normal. A&O x 3. Gait is normal.    Psychiatric: Judgement and insight intact. Mood and affect are normal.     Physical Exam      Lab Review:  Office Visit on 09/13/2022   Component Date Value Ref Range Status    Glucose 09/13/2022 Negative  Negative Final    Bilirubin 09/13/2022 Negative  Negative Final    Ketone 09/13/2022 Trace (A)  Negative Final    Specific Gravity 09/13/2022 1.025  <1.005, 1.005, 1.010, 1.015, 1.020, 1.025, 1.030 Final    Blood 09/13/2022 Negative  Negative Final    pH 09/13/2022 7.0  <5.0, 5.0, 5.5, 6.0, 6.5, 7.0, 7.5, 8.0, 8.5 Final    Protein 09/13/2022 Trace (A)  Negative Final    Urobilinogen 09/13/2022 0.2 mg/dL  0.2 mg/dL, 1.0 mg/dL Final    Nitrite 16/05/9603 Negative  Negative Final    Leukocyte 09/13/2022 Negative  Negative Final       Imaging:    Prior pertinent visits, outside records, labs, and imaging reviewed.     ASSESSMENT & PLAN:     1. Dysuria    2. Frequent urination    3. Acute UTI      Orders Placed This Encounter    Bacterial Culture Urine    Fungal Culture, Genital Swab    Bacterial Vaginosis Screen    Trichomonas vaginalis Antigen, Genital Swab    Urinalysis Routine    UA,Dipstick    UA,Microscopic    POCT urinalysis dipstick (Ambulatory Protocol - Point of Care Urinalysis Dipstick)    phenazopyridine 200 mg tablet    nitrofurantoin, macrocrystal monohydrate, (MACROBID) 100 mg capsule       Patient is a 27 y.o. year-old  female presenting to the clinic for signs and symptoms consistent with an urinary tract infection. Patient does not have any signs or symptoms of sepsis, pyelonephritis, ectopic pregnancy, kidney stone, or other serious etiology. Low concern for STI, vaginal infection, or PID given no change in vaginal discharge and low risk based on brief sexual history. Low risk of MDR organism given no hx of recurrent/frequent UTIs, hospitalizations for UTI, or other risk factors. Patient will be started on a course of antibiotics. Urine culture is pending. Close follow up with PMD recommended. I gave the patient strict ER precautions for any new / worsening symptoms.    The above plan of care, diagnosis, orders, and follow-up were discussed with the patient, who understands and agrees.    Questions related to this recommended plan of care were answered.  See AVS for additional information and counseling materials provided to the patient.      Arlee Muslim, MD 09/13/2022   Immediate Care Physician

## 2022-09-15 ENCOUNTER — Ambulatory Visit: Payer: BLUE CROSS/BLUE SHIELD

## 2022-09-15 LAB — Fungal Culture: FUNGAL CULTURE: NEGATIVE

## 2022-09-15 LAB — Bacterial Culture Urine: BACTERIAL CULTURE URINE: NO GROWTH

## 2022-09-16 ENCOUNTER — Telehealth: Payer: BLUE CROSS/BLUE SHIELD

## 2022-09-16 NOTE — Telephone Encounter
Called pt and didn't pick up. Left VM advising Dr. Scheryl Marten would like to see her today at 4:45 pm if her symptoms still persist.

## 2022-09-17 LAB — Fungal Culture: FUNGAL CULTURE: NEGATIVE

## 2022-09-22 ENCOUNTER — Ambulatory Visit: Payer: BLUE CROSS/BLUE SHIELD

## 2022-09-30 ENCOUNTER — Telehealth: Payer: BLUE CROSS/BLUE SHIELD

## 2022-09-30 NOTE — Telephone Encounter
Left vm for pt  MD no longer available on Tuesdays  Apt has been rescheduled

## 2022-10-05 ENCOUNTER — Ambulatory Visit: Payer: BLUE CROSS/BLUE SHIELD

## 2022-10-06 ENCOUNTER — Ambulatory Visit: Payer: BLUE CROSS/BLUE SHIELD

## 2022-10-18 ENCOUNTER — Ambulatory Visit: Payer: BLUE CROSS/BLUE SHIELD

## 2022-11-03 ENCOUNTER — Ambulatory Visit: Payer: BLUE CROSS/BLUE SHIELD

## 2022-11-11 ENCOUNTER — Ambulatory Visit: Payer: BLUE CROSS/BLUE SHIELD

## 2022-11-17 ENCOUNTER — Ambulatory Visit: Payer: BLUE CROSS/BLUE SHIELD

## 2022-11-18 ENCOUNTER — Ambulatory Visit: Payer: BLUE CROSS/BLUE SHIELD

## 2022-11-22 ENCOUNTER — Ambulatory Visit: Payer: BLUE CROSS/BLUE SHIELD

## 2022-11-24 ENCOUNTER — Ambulatory Visit: Payer: BLUE CROSS/BLUE SHIELD

## 2023-01-02 ENCOUNTER — Ambulatory Visit: Payer: BLUE CROSS/BLUE SHIELD

## 2023-01-05 ENCOUNTER — Telehealth: Payer: BLUE CROSS/BLUE SHIELD

## 2023-01-05 DIAGNOSIS — K130 Diseases of lips: Secondary | ICD-10-CM

## 2023-01-05 MED ORDER — HYDROCORTISONE 1 % EX CREA
1 refills | Status: AC
Start: 2023-01-05 — End: ?

## 2023-01-05 NOTE — Progress Notes
[facility]  Internal Medicine-Pediatrics    PATIENT: Megan Caldwell  MRN: [id number]  DOB: Dec 07, 1995  DATE OF SERVICE: 01/05/2023    Patient Consent to Telehealth Questionnaire        No data to display              - I agree  to be treated via a video visit and acknowledge that I may be liable for any relevant copays or coinsurance depending on my insurance plan.  - I understand that this video visit is offered for my convenience and I am able to cancel and reschedule for an in-person appointment if I desire.  - I also acknowledge that sensitive medical information may be discussed during this video visit appointment and that it is my responsibility to locate myself in a location that ensures privacy to my own level of comfort.  - I also acknowledge that I should not be participating in a video visit in a way that could cause danger to myself or to those around me (such as driving or walking).  If my provider is concerned about my safety, I understand that they have the right to terminate the visit.     CHIEF COMPLAINT:   Chief Complaint   Patient presents with    Rash     Lips (x's 3 weeks).  Called for virtual rooming.  Apt link sent via text and email.  CM        HPI   Megan Caldwell is a 27 y.o. female presents for   Chief Complaint   Patient presents with    Rash     Lips (x's 3 weeks).  Called for virtual rooming.  Apt link sent via text and email.  CM     - getting ''white patches'' and around her lips and lighter color on the corners of her mouth with tiny, white bumps  - going on for the past 3 weeks  - has not happened in the past  - no pain and no tongue discoloration  - has applied lip balm and vaseline to address this w/o relief  - no new medications - stopped her birth control 2 months ago  - no dietary limitations  - no other skin involvement     MEDS     Outpatient Medications Prior to Visit   Medication Sig    ALPRAZolam 0.5 mg tablet Take 1 tablet (0.5 mg total) by mouth as needed for.    busPIRone 5 mg tablet Take 1 tablet (5 mg total) by mouth two (2) times daily.    Cetirizine HCl (ZYRTEC ALLERGY PO) Take by mouth as needed for.    compounding medication Amitriptyline 2%/baclofen 2% Apply 1 click to vulva at bedtime with gradual increase up tot 3 times a day... (Patient not taking: Reported on 09/13/2022.)    compounding medication Estradiol 0.03%/testosterone 0/15% in verbase. Apply 0.5g to vaginal opening every other night..    estradiol (ESTRACE) 0.1 mg/g vaginal cream Apply 0.5g (pea size amount) nightly for two weeks followed by twice per week thereafter.. (Patient not taking: Reported on 06/24/2022.)    HAILEY 24 FE 1-20 MG-MCG(24) tablet Take 1 tablet by mouth daily.    hydrocortisone 1% cream Apply to affected areas twice daily as needed.Marland Kitchen    LINZESS 72 MCG capsule Take 1 capsule (72 mcg total) by mouth daily. (Patient not taking: Reported on 06/24/2022.)    norethindrone (MICRONOR) 0.35 mg tablet Take 1  tablet (0.35 mg total) by mouth daily. (Patient not taking: Reported on 09/13/2022.)     No facility-administered medications prior to visit.       PHYSICAL EXAM (limited due to this being a televisit)   (The following were examined and were normal unless noted otherwise)  Gen  [x]  Appearance  [x]  Alert/NAD   Eyes  [x]  Conjunctiva    Resp  [x]  Effort    Oral mucosa  White bumps at corner of mouth with no erythema or fissuring, normal appearance of buccal mucosa and normal appearance of tongue      A&P   Megan Caldwell is a a 28 y.o. female presenting for the following:    1. Cheilitis  - suspect atopic cheilitis   - lower suspicion for contact dermatitis or vitamin deficiency  - counseled on avoidance of makeup to prevent worsening of findings  - trial of topical steroid:  - hydrocortisone 1% cream; Apply to affected areas twice daily as needed - do not use for more than 2 consecutive weeks.  Dispense: 30 g; Refill: 1  - if no relief, may use OTC topical antifungal to address potential fugal etiology  - Referral to Dermatology if no relief with the above    The above recommendation were discussed with the patient.  The patient has all questions answered satisfactorily and is in agreement with this recommended plan of care.    Author:    Sena Hitch Brysun Eschmann   01/05/2023 1:50 PM

## 2023-01-09 ENCOUNTER — Telehealth: Payer: BLUE CROSS/BLUE SHIELD

## 2023-01-09 NOTE — Telephone Encounter
01/09/2023  DOS: 01/11/2023  POOL MESSAGE SENT PT NOT CLR CALLED PT @ PHONE: [phone] REF. CALL#: 01/09/2023 04:00 PM /NO ANSWER/LEFT VM TO CALLBACK WITH INS INFO OR ADVISE IF SELFPAY.  FCU REP- Margrett Rud PHONE#: [phone] EXT. [phone]

## 2023-01-11 ENCOUNTER — Ambulatory Visit: Payer: BLUE CROSS/BLUE SHIELD

## 2023-02-17 ENCOUNTER — Telehealth: Payer: BLUE CROSS/BLUE SHIELD

## 2023-02-17 NOTE — Telephone Encounter
POOL SENT   02/17/2023  DOS: 02/22/2023  POOL MESSAGE SENT PT NOT CLR CALLED PT @ PHONE: [phone] 02/17/2023 04:00 PM NO ANSWER LVM NO INS INFO ON FILE ADVISES TO CALL BACK TO PROVIDE INSURANCE INFO FOR VISIT IF NOT PT WILL BE SELF PAY.  FCU REPWallie Char PHONE#: [phone] EXT. [phone]

## 2023-02-22 ENCOUNTER — Ambulatory Visit: Payer: BLUE CROSS/BLUE SHIELD

## 2023-04-06 ENCOUNTER — Telehealth: Payer: BLUE CROSS/BLUE SHIELD

## 2023-04-06 NOTE — Telephone Encounter
FC[facility] [employer] Verification - Insurance Verification/Authorization     Regarding: Insurance Verification [x]     Authorization []    Missing Documentation []                           Date of Service:   04/12/2023      Provider:       Leonette Most., MD, MPH [facility]     Reason: Loraine Leriche appropriate reasoning       [x]  Patient Not Financially Cleared     [x]  Not a covered benefit  [x]  No active Insurance Coverage     []  Authorization Denied (Please see below denial Reason)     []  Patient Authorization approved and is cleared to schedule appt.          Other (explain):    CONTACTED PT AND LEFT VM ADVISING TO RETURN CALL WITH UPDATED INS INFO

## 2023-04-12 ENCOUNTER — Ambulatory Visit: Payer: BLUE CROSS/BLUE SHIELD

## 2023-05-05 ENCOUNTER — Ambulatory Visit: Payer: PRIVATE HEALTH INSURANCE

## 2023-05-17 ENCOUNTER — Ambulatory Visit: Payer: PRIVATE HEALTH INSURANCE

## 2023-05-17 DIAGNOSIS — Z0000 Encounter for general adult medical examination without abnormal findings: Secondary | ICD-10-CM

## 2023-05-17 DIAGNOSIS — Z23 Encounter for immunization: Secondary | ICD-10-CM

## 2023-05-17 DIAGNOSIS — J329 Chronic sinusitis, unspecified: Secondary | ICD-10-CM

## 2023-05-17 NOTE — Progress Notes
[facility] Primary Care  Internal Medicine-Pediatrics    PATIENT: Megan Caldwell  MRN: [id number]  DOB: 1996/07/30  DATE OF SERVICE: 05/17/2023  CHIEF COMPLAINT:   Chief Complaint   Patient presents with    Annual Exam        HPI   Megan Caldwell is a 27 y.o. female who presents for annual exam.    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 Treated with topical estrogen/testosterone, which has provided relief.  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   - Seasonal Allergies  - Hx of migraine:  - Anxiety: on buspirone 5 mg BID. Sparingly uses alprazolam 0.5 mg daily PRN for acute anxiety. Mood currently ''ok.''   - IBS and GERD history: some burning noticed in the morning   - Vaccines:   [x]  Covid vaccinated x3 or 4 - eligible for 2024 vaccine   []  Flu vaccinated (if applicable)   [x]  Tetanus Booster w/in last 10 years   []  Shingles completed   []  PNA vaccine completed  - Screenings:  []  Screened for HIV   []  Screened for Hep B   []  Screened for Hep C   []  Colon Cancer Screening (if applicable) n/a   []  Breast Cancer Screening (if applicable) n/a   []  DEXA screening (if applicable) n/a   []  Other screens: due  PMH     Past Medical History:   Diagnosis Date    Anxiety     In feet (dancer)    Arthritis     Chicken pox     Had when I was a kid    Common migraine     GERD (gastroesophageal reflux disease)     Headache     Irritable bowel syndrome     Seasonal allergies     Small intestinal bacterial overgrowth (SIBO)     s/p treatment       PSxH   No past surgical history on file.    FamHx     Family History   Problem Relation Age of Onset    Hearing loss Mother     Hypertension Mother     Alcohol abuse Father     Drug abuse Father     Arthritis Maternal Grandmother     Diabetes Maternal Grandmother     Hypertension Maternal Grandmother     Diabetes Maternal Grandfather     Hypertension Maternal Grandfather     Sudden death Cousin         1st cousin once removed       SocHx     Social History     Tobacco Use    Smoking status: Never    Smokeless tobacco: Never   Substance Use Topics    Alcohol use: Yes     Alcohol/week: 0.6 oz     Types: 1 Glasses of Wine (5 oz) per week     Comment: Socially    Drug use: Never       ALL   No Known Allergies    Meds  Outpatient Medications Prior to Visit   Medication Sig    ALPRAZolam 0.5 mg tablet Take 1 tablet (0.5 mg total) by mouth as needed for.    busPIRone 5 mg tablet Take 1 tablet (5 mg total) by mouth two (2) times daily.    Cetirizine HCl (ZYRTEC ALLERGY PO) Take by mouth as needed for.    compounding medication Estradiol 0.03%/testosterone 0/15% in verbase. Apply 0.5g to vaginal opening every other night.Marland Kitchen    HAILEY 24 FE 1-20 MG-MCG(24) tablet Take 1 tablet by mouth daily. (Patient not taking: Reported on 05/17/2023)    hydrocortisone 1% cream Apply to affected areas twice daily as needed - do not use for more than 2 consecutive weeks. (Patient not taking: Reported on 05/17/2023)    LINZESS 72 MCG capsule Take 1 capsule (72 mcg total) by mouth daily.    norethindrone (MICRONOR) 0.35 mg tablet Take 1 tablet (0.35 mg total) by mouth daily.     No facility-administered medications prior to visit.       ROS   [x]  10-point ROS reviewed: normal unless stated in HPI              PHYSICAL EXAM     Last Recorded Vital Signs:    05/17/23 1634   BP: 127/87   Pulse: 84   Resp: 14   Temp: 36.7 ?C (98 ?F)   SpO2: 100%     Body mass index is 20.62 kg/m?Marland Kitchen    General - Awake and alert, NAD  CVS - S1 and S2 heard, RRR, no murmurs  Pulm - Lungs CTAB, Good respiratory effort  GI - BS+, Nontender to palpation  Breast - pt deferred  Eyes - Clear conjunctiva  ENT - Oropharynx clear without exudate, Nares clear, Tympanic membranes normal  Neck - Supple with no lymphadenopathy  Psych - Normal mood and affect currently    A&P   Megan Caldwell is a a 27 y.o. female presenting for the following:    Frequent Sinus Infections  - counseled on use of sinus saline rinses along with antihistamine and nasal steroid   - if persisting, will send to ENT for additional eval    2. Flu vaccine need  - Influenza vaccine IM;   PF    3. Encounter for administration of COVID-19 vaccine  - COVID-19 vaccine IM [employer]) age 42 years and older    4. Healthcare maintenance  - Reviewed/Updated histories and med list.  - SBP at goal, DBP near goal - monitor, BMI noted, Exam unremarkable.  - Routine/Screening Labs:   - none today per pt request  - Vaccines:   - as above  - Screenings:   - Pap due  - RTC in 3 months for Pap smear     The above recommendations were discussed with the patient. The patient had all questions answered satisfactorily and is in agreement with this recommended plan of care.    Author:    Sena Hitch Geraldin Habermehl   05/17/2023   5:07 PM

## 2023-05-23 ENCOUNTER — Ambulatory Visit: Payer: PRIVATE HEALTH INSURANCE

## 2023-05-29 ENCOUNTER — Ambulatory Visit: Payer: BLUE CROSS/BLUE SHIELD

## 2023-06-15 ENCOUNTER — Ambulatory Visit: Payer: PRIVATE HEALTH INSURANCE

## 2023-07-05 ENCOUNTER — Ambulatory Visit: Payer: PRIVATE HEALTH INSURANCE

## 2023-07-17 ENCOUNTER — Ambulatory Visit: Payer: PRIVATE HEALTH INSURANCE

## 2023-07-17 DIAGNOSIS — K638219 Small intestinal bacterial overgrowth: Secondary | ICD-10-CM

## 2023-07-17 MED ORDER — PANTOPRAZOLE SODIUM 40 MG PO TBEC
40 mg | ORAL_TABLET | Freq: Every day | ORAL | 2 refills | Status: AC
Start: 2023-07-17 — End: ?

## 2023-07-17 NOTE — Progress Notes
OUTPATIENT GASTROENTEROLOGY PATIENT CONSULT NOTE  07/17/2023    CONSULTANT:  Dr Carin Hock  REFERRING PROVIDER: Leonette Most.*    Reason for Consultation:  history of SIBO as well as possible GERD     History of Present Illness:     Megan Caldwell is a 27 y.o.  female with history of anxiety, arthritis, and IBS-C who presents for history of SIBO as well as possible GERD.     06/2022: As a kid the patient would be constipated or would hold stools. As a teen and in college went days without going to the restroom but didn't notice until extreme pain then would void. In 2020 September symptoms worsened in that patient always felt nauseous. Was diagnosed with IBS-C in 2020 (EGD showed just reflux) and symptoms improved notably on trulance but due to insurance issues, switched to linzess which also worked. Moved from [facility] January 2022 and ran out of linzess. Started managing symptoms with miralax successfully. Over the past year she has had several visits/workup regarding UTIs and pelvic floor issues and was on abx in August 2023, which prompted her to get established with GI. She has continued to experience lower abdominal discomfort and bloating.      She has been on linzess since December 2022. She takes 72mg  daily. Has 1-2 BMs Types 3-4 daily. Has more success with passing a BM is linzess is taken right before a meal.      07/17/23: Patient felt well shortly after SIBO treatment for months. CALM powder causing some stomach irritation. September 2024 patient with stomach burning that would happen in the AM but no change in lifestyle. Patient had been on abx for UTI right before this and thought perhaps the 2 were connected (also tried vaginal suppository). Now patient will have random bouts of stomach pain when she wakes up.  Earlier in the year the patient was on different probiotics. After she eats she will get bloating, indigestion, belching. Patient goes to pelvic floor therapy and unclear why there is an increased urgency to urinate after eating.    Past Medical History:    Past Medical History:   Diagnosis Date    Anxiety     In feet (dancer)    Arthritis     Chicken pox     Had when I was a kid    Common migraine     GERD (gastroesophageal reflux disease)     Headache     Irritable bowel syndrome     Seasonal allergies     Small intestinal bacterial overgrowth (SIBO)     s/p treatment       Past Surgical History:    No past surgical history on file.    Outpatient Medications:    Outpatient Medications Prior to Visit   Medication Sig    ALPRAZolam 0.5 mg tablet Take 1 tablet (0.5 mg total) by mouth as needed for.    busPIRone 5 mg tablet Take 1 tablet (5 mg total) by mouth two (2) times daily.    Cetirizine HCl (ZYRTEC ALLERGY PO) Take by mouth as needed for.    compounding medication Estradiol 0.03%/testosterone 0/15% in verbase. Apply 0.5g to vaginal opening every other night..     No facility-administered medications prior to visit.       Family History:    Family History   Problem Relation Age of Onset    Hearing loss Mother     Hypertension Mother  Alcohol abuse Father     Drug abuse Father     Arthritis Maternal Grandmother     Diabetes Maternal Grandmother     Hypertension Maternal Grandmother     Diabetes Maternal Grandfather     Hypertension Maternal Grandfather     Sudden death Cousin         1st cousin once removed       Social History:    Social History     Tobacco Use    Smoking status: Never    Smokeless tobacco: Never   Substance Use Topics    Alcohol use: Yes     Alcohol/week: 0.6 oz     Types: 1 Glasses of Wine (5 oz) per week     Comment: Socially    Drug use: Never       Review of Systems: A 12- point review of systems was negative except where noted in the HPI    Physical Examination:    Vital Signs: Ht 5' 3'' (1.6 m)  ~ BMI 20.62 kg/m?   Gen: no acute distress, alert and oriented  HEENT: anicteric sclera, clear oropharynx.   Pulm: no respiratory distress, CTAB  CV: regular rate and rhythm  EXB:MWUX, nontender, nondistended, no rebound or guarding, no hepatomegaly   Ext: no peripheral edema     Laboratory Data: reviewed    Studies: reviewed     Impression: Megan Caldwell is a 27 y.o.  female with history of anxiety, arthritis, and IBS-C who presents for history of SIBO as well as possible GERD.     Recommendations:     #SIBO  - patient with positive lactulose breath tests in 2023 that was treated with rifaximin for 2 weeks and patient had 3+ months of complete symptom remission  - recheck lactulose breath test for SIBO    #possible reflux  - patient with burning in mid abdomen early in the AM with clinical picture suspicious for GERD  - omeprazole has not helped in the past  - try pantoprazole 40mg  taken 30-45 min before dinner for 1 month     Carin Hock, MD MPH  Clinical Instructor  [facility] [facility]

## 2023-07-20 ENCOUNTER — Telehealth: Payer: PRIVATE HEALTH INSURANCE

## 2023-07-20 NOTE — Telephone Encounter
Call Back Request      Reason for call back: pt states she was advised to call us back if she did not get a call from [facility] for her breath test. Pt states she has not gotten the call and would like for [place] to follow up and update her .     Any Symptoms:  []  Yes  [x]  No      If yes, what symptoms are you experiencing:    Duration of symptoms (how long):    Have you taken medication for symptoms (OTC or Rx):      If call was taken outside of clinic hours:    [] Patient or caller has been notified that this message was sent outside of normal clinic hours.     [] Patient or caller has been warm transferred to the physician's answering service. If applicable, patient or caller informed to please call us back if symptoms progress.  Patient or caller has been notified of the turnaround time of 1-2 business day(s).

## 2023-07-24 ENCOUNTER — Ambulatory Visit: Payer: PRIVATE HEALTH INSURANCE

## 2023-07-24 NOTE — Telephone Encounter
Order has been faxed to [facility]. PT notified via mychart.

## 2023-07-28 ENCOUNTER — Ambulatory Visit: Payer: PRIVATE HEALTH INSURANCE

## 2023-07-31 ENCOUNTER — Ambulatory Visit: Payer: PRIVATE HEALTH INSURANCE

## 2023-08-14 ENCOUNTER — Other Ambulatory Visit: Payer: PRIVATE HEALTH INSURANCE

## 2023-08-15 NOTE — Telephone Encounter
Pt scheduled in person for 1/22

## 2023-08-30 ENCOUNTER — Ambulatory Visit: Payer: PRIVATE HEALTH INSURANCE

## 2023-08-30 ENCOUNTER — Other Ambulatory Visit: Payer: PRIVATE HEALTH INSURANCE

## 2023-09-08 ENCOUNTER — Ambulatory Visit: Payer: PRIVATE HEALTH INSURANCE

## 2023-09-21 ENCOUNTER — Ambulatory Visit: Payer: BLUE CROSS/BLUE SHIELD

## 2023-09-21 DIAGNOSIS — F419 Anxiety disorder, unspecified: Secondary | ICD-10-CM

## 2023-09-21 DIAGNOSIS — Z5181 Encounter for therapeutic drug level monitoring: Secondary | ICD-10-CM

## 2023-09-21 NOTE — Progress Notes
[facility] Primary Care  Internal Medicine-Pediatrics    PATIENT: Megan Caldwell  MRN: [id number]  DOB: Feb 16, 1996  DATE OF SERVICE: 09/21/2023    Patient Consent to Telehealth Questionnaire        No data to display              - I agree  to be treated via a video visit and acknowledge that I may be liable for any relevant copays or coinsurance depending on my insurance plan.  - I understand that this video visit is offered for my convenience and I am able to cancel and reschedule for an in-person appointment if I desire.  - I also acknowledge that sensitive medical information may be discussed during this video visit appointment and that it is my responsibility to locate myself in a location that ensures privacy to my own level of comfort.  - I also acknowledge that I should not be participating in a video visit in a way that could cause danger to myself or to those around me (such as driving or walking).  If my provider is concerned about my safety, I understand that they have the right to terminate the visit.     CHIEF COMPLAINT:   Chief Complaint   Patient presents with    Medication Refill        HPI   Maxyne Quinetta Shilling is a 28 y.o. female presents for   Chief Complaint   Patient presents with    Medication Refill     - uncontrolled anxiety  - has been on buspirone 5 mg BID  - tolerating med  - planning to increase therapy visits   - also requesting alprazolam refill - has used 0.25 mg nightly prn - uses sparingly   - normal exam within the past 4 months    MEDS     Outpatient Medications Prior to Visit   Medication Sig    busPIRone 5 mg tablet Take 1 tablet (5 mg total) by mouth two (2) times daily.    Cetirizine HCl (ZYRTEC ALLERGY PO) Take by mouth as needed for.    compounding medication Estradiol 0.03%/testosterone 0/15% in verbase. Apply 0.5g to vaginal opening every other night..    ALPRAZolam 0.5 mg tablet Take 1 tablet (0.5 mg total) by mouth as needed for.     No facility-administered medications prior to visit.       PHYSICAL EXAM (limited due to this being a televisit)   (The following were examined and were normal unless noted otherwise)  Gen  [x]  Appearance  [x]  Alert/NAD    Eyes  [x]  Conjunctiva     Resp  [x]  Effort     Psych  []  Mood/Affect Anxiety as noted in HPI        LABS/STUDIES   I have:   [x]  Reviewed/ordered []  1 [x]  2 []  >= 3 unique laboratory, radiology, and/or diagnostic tests noted below    []  Reviewed []  1 []  2 []  >= 3 prior external notes and incorporated into patient assessment    []  Discussed management or test interpretation with external provider(s) as noted           09/21/2023     4:00 PM   GAD-7   Feeling nervous, anxious, or on edge More than half the days   Not being able to stop or control worrying More than half the days   Worrying too much about different things Nearly every day  Trouble relaxing More than half the days   Being so restless that is hard to sit still Several days   Becoming easily annoyed or irritable More than half the days   Feeling afraid, as if something awful might happen More than half the days   Total Score 14       Total Score Interpretation   >= 10 Possible diagnosis of GAD; confirm by further evaluation   5 Mild anxiety   10 Moderate anxiety   15 Severe anxiety     Most Recent PHQ-9 Score: 6 (09/21/23 1600)    Questionnaire:    Little interest or pleasure in doing things: Several days-Little interest or pleasure in doing things: 1  Feeling down, depressed, or hopeless: Several days-Feeling down, depressed, or hopeless: 1  Trouble falling or staying asleep, or sleeping too much: Several days-Trouble falling or staying asleep, or sleeping too much: 1  Feeling tired or having little energy: Several days-Feeling tired or having little energy: 1  Poor appetite or overeating: Not at all-Poor appetite or overeating: 0  Feeling bad about yourself - or that you are a failure or have let yourself or your family down: Several days-Feeling bad about yourself - or that you are a failure or have let yourself or your family down: 1  Trouble concentrating on things, such as reading the newspaper or watching television: Several days-Trouble concentrating on things, such as reading the newspaper or watching television: 1  Moving or speaking so slowly that other people could have noticed. Or the opposite - being so fidgety or restless that you have been moving around a lot more than usual: Not at all-Moving or speaking so slowly that other people could have noticed. Or the opposite - being so fidgety or restless that you have been moving around a lot more than usual: 0  Thoughts that you would be better off dead, or of hurting yourself in some way: Not at all-Thoughts that you would be better off dead, or of hurting yourself in some way: 0  Total Score: 6        PHQ-9 Score   Depression Severity     Proposed Treatment Actions    (reference)      0  -  4 None - minimal  None     5  -  9  Mild  Watchful waiting; repeat PHQ-9 at follow-up    10 - 14  Moderate  Treatment plan, considering counseling, follow-up and/or pharmacotherapy    15 - 19  Moderately Severe  Active treatment with pharmacotherapy and/or psychotherapy    20 - 27  Severe  Immediate initiation of pharmacotherapy and, if severe impairment or poor response to therapy, expedited referral to a mental health specialist for psychotherapy and/or collaborative management          A&P   Krystale Vara Mairena is a a 28 y.o. female presenting for the following:    1. Anxiety/Med monitoring   - due to uncontrolled anxiety, increasing dose of buspirone to 7.5 mg BID. Sent:  - busPIRone 5 mg tablet; Take 1.5 tablets (7.5 mg total) by mouth three (3) times daily.  Dispense: 90 tablet; Refill: 5  - refilling xanax d/t sparing use and recently normal exam  - CURES reviewed w/no abnormalities  - advised against mixing with other sedatives, including alcohol  - sent:  - ALPRAZolam 0.25 mg tablet; Take 1 tablet (0.25 mg total) by mouth daily as needed for Anxiety. Max Daily  Amount: 0.25 mg  Dispense: 30 tablet; Refill: 0  - f/u in 1-2 months in person to monitor    The above recommendation were discussed with the patient.  The patient has all questions answered satisfactorily and is in agreement with this recommended plan of care.    Author:    Sena Hitch Gayla Benn   09/21/2023 4:02 PM

## 2023-09-22 ENCOUNTER — Telehealth: Payer: PRIVATE HEALTH INSURANCE

## 2023-09-22 MED ORDER — ALPRAZOLAM 0.25 MG PO TABS
.25 mg | ORAL_TABLET | Freq: Every day | ORAL | 0 refills | Status: AC | PRN
Start: 2023-09-22 — End: ?

## 2023-09-22 MED ORDER — BUSPIRONE HCL 5 MG PO TABS
7.5 mg | ORAL_TABLET | Freq: Three times a day (TID) | ORAL | 5 refills | Status: AC
Start: 2023-09-22 — End: ?

## 2023-09-22 NOTE — Telephone Encounter
 Called patient and LVM to assist in scheduling a follow up appointment in person visit (can be 15 min) to monitor vital signs and mood - can be 1-2 months from now. Tentatively scheduled patient for 3/12. Please transfer call if appointment date and time does not work for patient.

## 2023-09-28 ENCOUNTER — Telehealth: Payer: BLUE CROSS/BLUE SHIELD

## 2023-09-28 NOTE — Telephone Encounter
 Message to Practice/Provider      Message: ALPRAZolam 0.25 mg tablet  needs a PA     Return call is not being requested by the patient or caller.    Patient or caller has been notified that their message will be reviewed within 1-2 business days.

## 2023-09-29 ENCOUNTER — Other Ambulatory Visit: Payer: BLUE CROSS/BLUE SHIELD

## 2023-09-29 ENCOUNTER — Telehealth: Payer: BLUE CROSS/BLUE SHIELD

## 2023-09-29 NOTE — Telephone Encounter
PA submitted via CoverMyMeds.  Waiting for outcome.

## 2023-09-29 NOTE — Telephone Encounter
 PA approved.

## 2023-09-29 NOTE — Telephone Encounter
 A prior authorization has been submitted for alprazolam via CoverMyMeds.  Waiting for outcome.

## 2023-10-10 MED ORDER — BUSPIRONE HCL 5 MG PO TABS
7.5 mg | ORAL_TABLET | Freq: Three times a day (TID) | ORAL | 4 refills
Start: 2023-10-10 — End: ?

## 2023-10-12 MED ORDER — BUSPIRONE HCL 5 MG PO TABS
7.5 mg | ORAL_TABLET | Freq: Three times a day (TID) | ORAL | 4 refills
Start: 2023-10-12 — End: ?

## 2023-10-18 ENCOUNTER — Ambulatory Visit: Payer: PRIVATE HEALTH INSURANCE

## 2023-10-18 DIAGNOSIS — N94819 Vulvodynia, unspecified: Secondary | ICD-10-CM

## 2023-10-18 DIAGNOSIS — F419 Anxiety disorder, unspecified: Secondary | ICD-10-CM

## 2023-10-18 MED ORDER — BUSPIRONE HCL 7.5 MG PO TABS
7.5 mg | ORAL_TABLET | Freq: Two times a day (BID) | ORAL | 3 refills | Status: AC
Start: 2023-10-18 — End: ?

## 2023-10-18 MED ORDER — ESTRADIOL 0.1 MG/GM VA CREA
1 g | Freq: Every day | VAGINAL | 2 refills | Status: AC
Start: 2023-10-18 — End: ?

## 2023-10-18 NOTE — Progress Notes
 [facility]  Internal Medicine-Pediatrics    PATIENT: Megan Caldwell  MRN: [id number]  DOB: November 22, 1995  DATE OF SERVICE: 10/18/2023    CHIEF COMPLAINT:   Chief Complaint   Patient presents with    Follow-up        HPI & Other Histories   Megan Caldwell is a 28 y.o. 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  Family History   Problem Relation Age of Onset    Hearing loss Mother     Hypertension Mother     Alcohol abuse Father     Drug abuse Father     Arthritis Maternal Grandmother     Diabetes Maternal Grandmother     Hypertension Maternal Grandmother     Diabetes Maternal Grandfather     Hypertension Maternal Grandfather     Sudden death Cousin         1st cousin once removed     Social History     Tobacco Use    Smoking status: Never    Smokeless tobacco: Never   Substance Use Topics    Alcohol use: Yes     Alcohol/week: 0.6 oz     Types: 1 Glasses of Wine (5 oz) per week     Comment: Socially    Drug use: Never     No Known Allergies    MEDS     Outpatient Medications Prior to Visit   Medication Sig    ALPRAZolam 0.25 mg tablet Take 1 tablet (0.25 mg total) by mouth daily as needed for Anxiety. Max Daily Amount: 0.25 mg    busPIRone 5 mg tablet Take 1 tablet (5 mg total) by mouth two (2) times daily.    busPIRone 5 mg tablet Take 1.5 tablets (7.5 mg total) by mouth three (3) times daily.    Cetirizine HCl (ZYRTEC ALLERGY PO) Take by mouth as needed for.    compounding medication Estradiol 0.03%/testosterone 0/15% in verbase. Apply 0.5g to vaginal opening every other night..     No facility-administered medications prior to visit.       PHYSICAL EXAM      Last Recorded Vital Signs:    10/18/23 1139   BP: 125/81   Pulse: 74   Resp: 18   Temp: 36.6 ?C (97.8 ?F)   SpO2: 100%     Body mass index is 22.14 kg/m?Marland Kitchen    General - Awake and alert, NAD  CVS - S1 and  S2 heard, RRR, no murmurs  Pulm - Lungs CTAB, Good respiratory effort  Eyes - Clear conjunctiva    LABS/STUDIES   I have:   [x]  Reviewed/ordered []  1 [x]  2 []  >= 3 unique laboratory, radiology, and/or diagnostic tests noted below    []  Reviewed []  1 []  2 []  >= 3 prior external notes and incorporated into patient assessment    []  Discussed management or test interpretation with external provider(s) as noted           09/21/2023     4:00 PM 10/18/2023    11:00 AM   GAD-7   Feeling nervous, anxious, or on edge More than half the days Several days   Not being able to stop or control worrying More than half the days More than half the days   Worrying too much about different things Nearly every day Several days   Trouble relaxing More than half the days Several days   Being so restless that is hard to sit still Several days Several days   Becoming easily annoyed or irritable More than half the days Several days   Feeling afraid, as if something awful might happen More than half the days Several days   Total Score 14 8       Total Score Interpretation   >= 10 Possible diagnosis of GAD; confirm by further evaluation   5 Mild anxiety   10 Moderate anxiety   15 Severe anxiety     Most Recent PHQ-9 Score: 6 (10/18/23 1100)    Questionnaire:    Little interest or pleasure in doing things: Not at all-Little interest or pleasure in doing things: 0  Feeling down, depressed, or hopeless: Several days-Feeling down, depressed, or hopeless: 1  Trouble falling or staying asleep, or sleeping too much: Several days-Trouble falling or staying asleep, or sleeping too much: 1  Feeling tired or having little energy: Several days-Feeling tired or having little energy: 1  Poor appetite or overeating: More than half the days-Poor appetite or overeating: 2  Feeling bad about yourself - or that you are a failure or have let yourself or your family down: Not at all-Feeling bad about yourself - or that you are a failure or have let yourself or your family down: 0  Trouble concentrating on things, such as reading the newspaper or watching television: Several days-Trouble concentrating on things, such as reading the newspaper or watching television: 1  Moving or speaking so slowly that other people could have noticed. Or the opposite - being so fidgety or restless that you have been moving around a lot more than usual: Not at all-Moving or speaking so slowly that other people could have noticed. Or the opposite - being so fidgety or restless that you have been moving around a lot more than usual: 0  Thoughts that you would be better off dead, or of hurting yourself in some way: Not at all-Thoughts that you would be better off dead, or of hurting yourself in some way: 0  Total Score: 6   PHQ-9 Score   Depression Severity     Proposed Treatment Actions    (reference)      0  -  4 None - minimal  None     5  -  9  Mild  Watchful waiting; repeat PHQ-9 at follow-up    10 - 14  Moderate  Treatment plan, considering counseling, follow-up and/or pharmacotherapy    15 - 19  Moderately Severe  Active treatment with pharmacotherapy and/or psychotherapy    20 - 27  Severe  Immediate initiation of pharmacotherapy and, if severe impairment or poor response to therapy, expedited referral to a mental health specialist for psychotherapy and/or collaborative management      A&P Suhaylah Tonnette Zwiebel is a a 28 y.o. female presenting for   Chief Complaint   Patient presents with    Follow-up       1. Anxiety (Primary)  - mood better controlled with higher dose of buspirone   - no changes  - refilled:  - busPIRone 7.5 MG tablet; Take 1 tablet (7.5 mg total) by mouth two (2) times daily.  Dispense: 180 tablet; Refill: 3  - rtc in ~4 weeks to fill out FMLA forms for work for anxiety    2. Vulvodynia  - refilled:  - estradiol (ESTRACE) 0.1 mg/g vaginal cream; Place 1 g vaginally daily.  Dispense: 42.5 g; Refill: 2  - will f/u with Ob/Gyn next month for continued management and Pap smear screening    The above recommendations were discussed with the patient. The patient had all questions answered satisfactorily and is in agreement with this recommended plan of care.    Author:    Sena Hitch Lucilla Petrenko   10/18/2023   11:40 AM

## 2023-10-24 ENCOUNTER — Ambulatory Visit: Payer: BLUE CROSS/BLUE SHIELD

## 2023-11-01 ENCOUNTER — Other Ambulatory Visit: Payer: BLUE CROSS/BLUE SHIELD

## 2023-11-01 ENCOUNTER — Ambulatory Visit: Payer: BLUE CROSS/BLUE SHIELD

## 2023-11-01 DIAGNOSIS — Z5181 Encounter for therapeutic drug level monitoring: Secondary | ICD-10-CM

## 2023-11-01 DIAGNOSIS — F419 Anxiety disorder, unspecified: Secondary | ICD-10-CM

## 2023-11-01 DIAGNOSIS — Z0289 Encounter for other administrative examinations: Secondary | ICD-10-CM

## 2023-11-01 NOTE — Progress Notes
 [facility]  Internal Medicine-Pediatrics    PATIENT: Megan Caldwell  MRN: [id number]  DOB: 11-16-1995  DATE OF SERVICE: 11/01/2023    Patient Consent to Telehealth Questionnaire        No data to display              - I agree  to be treated via a video visit and acknowledge that I may be liable for any relevant copays or coinsurance depending on my insurance plan.  - I understand that this video visit is offered for my convenience and I am able to cancel and reschedule for an in-person appointment if I desire.  - I also acknowledge that sensitive medical information may be discussed during this video visit appointment and that it is my responsibility to locate myself in a location that ensures privacy to my own level of comfort.  - I also acknowledge that I should not be participating in a video visit in a way that could cause danger to myself or to those around me (such as driving or walking).  If my provider is concerned about my safety, I understand that they have the right to terminate the visit.     CHIEF COMPLAINT: Mood/FMLA paperwork     HPI   Megan Caldwell is a 28 y.o. female presents for mood/FMLA paperwork    - feels like buspirone 7.5 mg BID is helping still with mood  - also has alprazolam if needed for panic attacks  - has good/bad days while at work - still feels like intermittent time off for mood would help significantly  - presents today to fill out FMLA paperwork for anxiety  - seeing therapist regularly    MEDS     Outpatient Medications Prior to Visit   Medication Sig    ALPRAZolam 0.25 mg tablet Take 1 tablet (0.25 mg total) by mouth daily as needed for Anxiety. Max Daily Amount: 0.25 mg    busPIRone 7.5 MG tablet Take 1 tablet (7.5 mg total) by mouth two (2) times daily.    Cetirizine HCl (ZYRTEC ALLERGY PO) Take by mouth as needed for.    compounding medication Estradiol 0.03%/testosterone 0/15% in verbase. Apply 0.5g to vaginal opening every other night.. estradiol (ESTRACE) 0.1 mg/g vaginal cream Place 1 g vaginally daily.     No facility-administered medications prior to visit.       PHYSICAL EXAM (limited due to this being a televisit)   (The following were examined and were normal unless noted otherwise)  Gen  [x]  Appearance  [x]  Alert/NAD    Eyes  [x]  Conjunctiva     Resp  [x]  Effort     Psych  []  Mood/Affect stable      A&P   Megan Caldwell is a a 27 y.o. female presenting for the following:    1. Anxiety (Primary)/med monitoring and filling out form  - no changes to mood regimen - has been helping  - filled out FMLA form - will send to patient via mychart    The above recommendation were discussed with the patient.  The patient has all questions answered satisfactorily and is in agreement with this recommended plan of care.    Author:    Sena Hitch Aamilah Augenstein   11/01/2023 9:32 AM

## 2023-11-02 ENCOUNTER — Ambulatory Visit: Payer: BLUE CROSS/BLUE SHIELD

## 2023-11-16 ENCOUNTER — Ambulatory Visit: Payer: BLUE CROSS/BLUE SHIELD

## 2023-12-14 ENCOUNTER — Other Ambulatory Visit: Payer: BLUE CROSS/BLUE SHIELD

## 2023-12-15 ENCOUNTER — Telehealth: Payer: BLUE CROSS/BLUE SHIELD

## 2023-12-15 NOTE — Telephone Encounter
 Last minute cancellation - was able to schedule patient on Monday 5/12.

## 2023-12-15 NOTE — Telephone Encounter
 Appointment Accommodation Request    Hi team,  Patient booked her video on 6/12 for new medication request. [name] is on the wait list as well    Please ask Dr if he can offer a sooner slot to discuss ?    Please call back with a update when staff can do so.  Thank you.  Appointment Type:video    Reason for sooner request:new medication    Date/Time Requested (If any): open    Last seen by MD: 11-01-23    Any Symptoms:  []  Yes  [x]  No      If yes, what symptoms are you experiencing:   Duration of symptoms (how long):     Patient or caller was offered an appointment but declined.n/a    Patient or caller was advised to seek emergency services if conditions are urgent or emergent.n/a    Patient or caller has been notified of the turnaround time of 1-2 business (days).yes

## 2023-12-15 NOTE — Telephone Encounter
 Please see pt message and advise

## 2023-12-18 ENCOUNTER — Ambulatory Visit: Payer: BLUE CROSS/BLUE SHIELD

## 2023-12-22 ENCOUNTER — Ambulatory Visit: Payer: BLUE CROSS/BLUE SHIELD

## 2023-12-27 ENCOUNTER — Ambulatory Visit: Payer: BLUE CROSS/BLUE SHIELD

## 2023-12-27 DIAGNOSIS — F5222 Female sexual arousal disorder: Secondary | ICD-10-CM

## 2023-12-27 DIAGNOSIS — N94819 Vulvodynia, unspecified: Secondary | ICD-10-CM

## 2023-12-27 NOTE — Progress Notes
 [facility]  Internal Medicine-Pediatrics    PATIENT: Megan Caldwell  MRN: [id number]  DOB: 08-23-1995  DATE OF SERVICE: 12/27/2023    Patient Consent to Telehealth Questionnaire        No data to display              - I agree  to be treated via a video visit and acknowledge that I may be liable for any relevant copays or coinsurance depending on my insurance plan.  - I understand that this video visit is offered for my convenience and I am able to cancel and reschedule for an in-person appointment if I desire.  - I also acknowledge that sensitive medical information may be discussed during this video visit appointment and that it is my responsibility to locate myself in a location that ensures privacy to my own level of comfort.  - I also acknowledge that I should not be participating in a video visit in a way that could cause danger to myself or to those around me (such as driving or walking).  If my provider is concerned about my safety, I understand that they have the right to terminate the visit.     CHIEF COMPLAINT: No chief complaint on file.       HPI   Megan Caldwell is a 29 y.o. female presents for No chief complaint on file.    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'' though patient denies a pleasurable sensation  - feels like a ''a lot of pressure'' and pain in ''waves'' around the genitalia   - feels like she has to relieve pressure by urinating, but urinating doesn't help  - episodes can last 20 min at a time - can feel multiple times a day for over 2 years  - was recommended to take amitriptyline by pelvic floor therapist to address pain  - has been on medication before for around a year for migraines and tolerated it    MEDS     Outpatient Medications Prior to Visit   Medication Sig    ALPRAZolam  0.25 mg tablet Take 1 tablet (0.25 mg total) by mouth daily as needed for Anxiety. Max Daily Amount: 0.25 mg busPIRone  7.5 MG tablet Take 1 tablet (7.5 mg total) by mouth two (2) times daily.    Cetirizine HCl (ZYRTEC ALLERGY PO) Take by mouth as needed for.    compounding medication Estradiol  0.03%/testosterone 0/15% in verbase. Apply 0.5g to vaginal opening every other night..    estradiol  (ESTRACE ) 0.1 mg/g vaginal cream Place 1 g vaginally daily.     No facility-administered medications prior to visit.       PHYSICAL EXAM (limited due to this being a televisit)   (The following were examined and were normal unless noted otherwise)  Gen  [x]  Appearance  [x]  Alert/NAD    Eyes  [x]  Conjunctiva     Resp  [x]  Effort       A&P   Megan Caldwell is a a 28 y.o. 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  Dispense: 90 tablet; Refill: 1  - patient to let me know if dose is effective or whether increase is warranted  - referral  placed to Urogynecology for further eval  - will consider pelvic MRI w/o contrast and w/nerve imaging for further eval if sx worsen     The above recommendation were discussed with the patient.  The patient has all questions answered satisfactorily and is in agreement with this recommended plan of care.    Author:    Soundra Duval Megan Caldwell   12/27/2023 5:11 PM

## 2023-12-28 MED ORDER — AMITRIPTYLINE HCL 10 MG PO TABS
10 mg | ORAL_TABLET | Freq: Every evening | ORAL | 1 refills | 75.00000 days | Status: AC
Start: 2023-12-28 — End: ?

## 2024-01-18 ENCOUNTER — Ambulatory Visit: Payer: BLUE CROSS/BLUE SHIELD

## 2024-01-31 ENCOUNTER — Other Ambulatory Visit: Payer: BLUE CROSS/BLUE SHIELD

## 2024-01-31 NOTE — Telephone Encounter
 Please see pt message and advise

## 2024-02-01 MED ORDER — AMITRIPTYLINE HCL 10 MG PO TABS
15 mg | ORAL_TABLET | Freq: Every evening | ORAL | 3 refills | 14.00000 days | Status: AC
Start: 2024-02-01 — End: ?

## 2024-02-13 ENCOUNTER — Ambulatory Visit: Payer: PRIVATE HEALTH INSURANCE

## 2024-04-06 ENCOUNTER — Other Ambulatory Visit: Payer: BLUE CROSS/BLUE SHIELD

## 2024-05-17 ENCOUNTER — Ambulatory Visit: Payer: PRIVATE HEALTH INSURANCE

## 2024-05-17 DIAGNOSIS — Z23 Encounter for immunization: Secondary | ICD-10-CM

## 2024-05-17 DIAGNOSIS — Z0000 Encounter for general adult medical examination without abnormal findings: Secondary | ICD-10-CM

## 2024-05-17 DIAGNOSIS — F419 Anxiety disorder, unspecified: Secondary | ICD-10-CM

## 2024-05-17 DIAGNOSIS — D72819 Decreased white blood cell count, unspecified: Secondary | ICD-10-CM

## 2024-05-17 DIAGNOSIS — F439 Reaction to severe stress, unspecified: Secondary | ICD-10-CM

## 2024-05-17 DIAGNOSIS — F32A Depression, unspecified depression type: Secondary | ICD-10-CM

## 2024-05-17 NOTE — Progress Notes
 [facility]  Internal Medicine-Pediatrics    PATIENT: Megan Caldwell  MRN: [id number]  DOB: 19-Apr-1996  DATE OF SERVICE: 05/17/2024  CHIEF COMPLAINT:   Chief Complaint   Patient presents with    Annual Exam        HPI   Artha Stavros is a 28 y.o. female who presents for annual exam.     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 Previously treated with topical estrogen/testosterone, but now on ketotifen topical. Recently did pelvic floor botox around 2 weeks ago under general anaesthesia w/another Urogynecologist doctor. Also doing pain reprocessing therapy.  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   - Seasonal Allergies: on certizine   - Hx of migraine:  - Anxiety: on buspirone  7.5 mg BID. Sparingly uses alprazolam  0.5 mg daily PRN for acute anxiety. Mood currently ''ok.''   - IBS and GERD history: some burning noticed in the morning   - Vaccines:   [x]  Covid vaccinated x2 - eligible for 2025 vaccine   []  Flu vaccinated (if applicable)   [x]  Tetanus Booster w/in last 10 years   []  Shingles completed   []  PNA vaccine completed  - Screenings:  []  Screened for HIV   []  Screened for Hep B   []  Screened for Hep C   []  Colon Cancer Screening (if applicable) n/a   []  Breast Cancer Screening (if applicable) n/a   []  DEXA screening (if applicable) n/a   []  Other screens:  PMH     Past Medical History:   Diagnosis Date    Anxiety     In feet (dancer)    Arthritis     Chicken pox     Had when I was a kid    Common migraine     GERD (gastroesophageal reflux disease) Headache     Irritable bowel syndrome     Seasonal allergies     Small intestinal bacterial overgrowth (SIBO)     s/p treatment       PSxH   No past surgical history on file.    FamHx     Family History   Problem Relation Age of Onset    Hearing loss Mother     Hypertension Mother     Alcohol abuse Father     Drug abuse Father     Arthritis Maternal Grandmother     Diabetes Maternal Grandmother     Hypertension Maternal Grandmother     Diabetes Maternal Grandfather     Hypertension Maternal Grandfather     Sudden death Cousin         1st cousin once removed       SocHx     Social History     Tobacco Use    Smoking status: Never    Smokeless tobacco: Never   Substance Use Topics    Alcohol use: Yes     Alcohol/week: 0.6 oz     Types: 1 Glasses of Wine (5 oz) per week  Comment: Socially    Drug use: Never       ALL   No Known Allergies    Meds     Outpatient Medications Prior to Visit   Medication Sig    ALPRAZolam  0.25 mg tablet Take 1 tablet (0.25 mg total) by mouth daily as needed for Anxiety. Max Daily Amount: 0.25 mg    busPIRone  7.5 MG tablet Take 1 tablet (7.5 mg total) by mouth two (2) times daily.    Cetirizine HCl (ZYRTEC ALLERGY PO) Take by mouth as needed for.    estradiol  (ESTRACE ) 0.1 mg/g vaginal cream Place 1 g vaginally daily.    hydrOXYzine 25 mg tablet Take 1 tablet (25 mg total) by mouth at bedtime.    amitriptyline  10 mg tablet Take 1 tablet (10 mg total) by mouth at bedtime. (Patient not taking: Reported on 05/17/2024)    amitriptyline  10 mg tablet Take 1.5 tablets (15 mg total) by mouth at bedtime. (Patient not taking: Reported on 05/17/2024)    compounding medication Estradiol  0.03%/testosterone 0/15% in verbase. Apply 0.5g to vaginal opening every other night.. (Patient not taking: Reported on 05/17/2024)     No facility-administered medications prior to visit.       ROS   [x]  10-point ROS reviewed: normal unless stated in HPI              Failed to redirect to the Timeline version of the REVFS SmartLink.     PHYSICAL EXAM     Last Recorded Vital Signs:    05/17/24 1633   BP: 121/77   Pulse: (!) 108   Resp: 14   Temp: 37.7 ?C (99.8 ?F)   SpO2: 100%     Body mass index is 20.73 kg/m?SABRA    General - Awake and alert, NAD  CVS - S1 and S2 heard, RRR( ~76 bpm), no murmurs  Pulm - Lungs CTAB, Good respiratory effort  GI - BS+, Nontender to palpation  Breast - pt deferred  Eyes - Clear conjunctiva  ENT - Oropharynx clear without exudate, Nares clear, Tympanic membranes normal  Neck - Supple with no lymphadenopathy  Psych - Anxious/Depressed mood and affect    LABS/STUDIES   I have:   [x]  Reviewed/ordered []  1 []  2 [x]  >= 3 unique laboratory, radiology, and/or diagnostic tests noted below    []  Reviewed []  1 []  2 []  >= 3 prior external notes and incorporated into patient assessment    []  Discussed management or test interpretation with external provider(s) as noted       Lab Studies:    CBC from outside facility 04/25/24  WBC (HTL)  4.00 - 11.00 1000/UL 3.3 Low    RBC (HTL)  3.67 - 5.11 MILL/UL 4.46   Hemoglobin (HTL)  11.6 - 15.4 g/dL 86.5   Hematocrit (HTL)  34.3 - 45.4 % 41.5   MCV (HTL)  80.0 - 100.0 FL 93   MCH (HTL)  27.0 - 33.0 pg 30   MCHC (HTL)  32.0 - 36.0 g/dL 67.6   RDW (HTL)  88.2 - 14.4 % 12.9   Platelet Count (HTL)  150 - 450 1000/UL 277   MPV  9.4 - 12.3 FL 10.8       TSH from outside facility 02/28/24   TSH (HTL)  mIU/L 0.75       CMP from outside facility 02/28/24  Glucose  65 - 99 mg/dL 77   Comment:  Fasting reference interval   Urea Nitrogen  7 - 25 mg/dL 12   Creatinine (HTL)  0.50 - 0.96 mg/dL 9.10   eGFR  > OR = 60 mL/min/1.18m2 90   BUN/Creatinine Ratio (HTL)  6 - 22 (calc) SEE NOTE:   Comment:    Not Reported: BUN and Creatinine are within     reference range.         Sodium  135 - 146 mmol/L 137   Potassium  3.5 - 5.3 mmol/L 4.7   Chloride  98 - 110 mmol/L 103   Carbon Dioxide  20 - 32 mmol/L 27   Calcium, Serum (HTL)  8.6 - 10.2 mg/dL 9.8   Total Protein  6.1 - 8.1 g/dL 7.4 Albumin  3.6 - 5.1 g/dL 4.5   Globulin  1.9 - 3.7 g/dL (calc) 2.9   A/G Ratio  1.0 - 2.5 (calc) 1.6   Bilirubin, Total  0.2 - 1.2 mg/dL 0.5   Alkaline Phosphatase  31 - 125 U/L 68   AST  10 - 30 U/L 17   ALT (HTL)  6 - 29 U/L 10         09/21/2023     4:00 PM 10/18/2023    11:00 AM 05/17/2024     5:00 PM   GAD-7   Feeling nervous, anxious, or on edge More than half the days Several days More than half the days   Not being able to stop or control worrying More than half the days More than half the days More than half the days   Worrying too much about different things Nearly every day Several days Nearly every day   Trouble relaxing More than half the days Several days More than half the days   Being so restless that is hard to sit still Several days Several days More than half the days   Becoming easily annoyed or irritable More than half the days Several days Nearly every day   Feeling afraid, as if something awful might happen More than half the days Several days More than half the days   Total Score 14 8 16        Total Score Interpretation   >= 10 Possible diagnosis of GAD; confirm by further evaluation   5 Mild anxiety   10 Moderate anxiety   15 Severe anxiety     Most Recent PHQ-9 Score: 12 (05/17/24 1700)    Questionnaire:    Little interest or pleasure in doing things: More than half the days-Little interest or pleasure in doing things: 2  Feeling down, depressed, or hopeless: Nearly every day-Feeling down, depressed, or hopeless: 3  Trouble falling or staying asleep, or sleeping too much: Several days-Trouble falling or staying asleep, or sleeping too much: 1  Feeling tired or having little energy: Several days-Feeling tired or having little energy: 1  Poor appetite or overeating: Nearly every day-Poor appetite or overeating: 3  Feeling bad about yourself - or that you are a failure or have let yourself or your family down: Several days-Feeling bad about yourself - or that you are a failure or have let yourself or your family down: 1  Trouble concentrating on things, such as reading the newspaper or watching television: Several days-Trouble concentrating on things, such as reading the newspaper or watching television: 1  Moving or speaking so slowly that other people could have noticed. Or the opposite - being so fidgety or restless that you have been moving around a lot  more than usual: Not at all-Moving or speaking so slowly that other people could have noticed. Or the opposite - being so fidgety or restless that you have been moving around a lot more than usual: 0  Thoughts that you would be better off dead, or of hurting yourself in some way: Not at all-Thoughts that you would be better off dead, or of hurting yourself in some way: 0  Total Score: 12   PHQ-9 Score   Depression Severity     Proposed Treatment Actions    (reference)      0  -  4 None - minimal  None     5  -  9  Mild  Watchful waiting; repeat PHQ-9 at follow-up    10 - 14  Moderate  Treatment plan, considering counseling, follow-up and/or pharmacotherapy    15 - 19  Moderately Severe  Active treatment with pharmacotherapy and/or psychotherapy    20 - 27  Severe  Immediate initiation of pharmacotherapy and, if severe impairment or poor response to therapy, expedited referral to a mental health specialist for psychotherapy and/or collaborative management      A&P   Meilah Jahliyah Trice is a a 28 y.o. female presenting for the following:    1. Anxiety/Depression  - uncontrolled mood per GAD-1 and PHQ-9  - Counseled on common side effects of weight gain and decreased libido with SSRIs. Also discussed potential increased SI. However, patient denies thoughts of suicide, so I have a low suspicion for this. In case there is increased SI, advised immediate discontinuation of the medication and to call me or go to ED directly. Patient voiced understanding.  - trial of:  - sertraline 25 mg tablet; Take 1 tablet (25 mg total) by mouth daily.  Dispense: 90 tablet; Refill: 3  - wrote letter for work leave for 07/08/24-08/07/24 to help with mood as most of stress is due to work  - rtc in 4-6 weeks via video to monitor    2. Leukopenia, unspecified type  - per patient, consulted w/Hematologist where mild leukopenia was not a concern  - not a new finding    4. Need for immunization against influenza (Primary)  - Influenza vaccine IM;   PF    5. Need for COVID-19 vaccine  - COVID-19 vaccine IM [employer]) age 63 years and older    6. Healthcare maintenance  - Reviewed/Updated histories and med list.  - BP at goal, BMI noted, Exam unremarkable.  - Routine/Screening Labs:   - reviewed recent labs above w/no need for repeat at this time  - Vaccines:   - as above  - Screenings:   - none today per pt request  - RTC as above     The above recommendations were discussed with the patient. The patient had all questions answered satisfactorily and is in agreement with this recommended plan of care.    Author:    Bernardino FELIX Zaley Talley   05/17/2024   4:49 PM

## 2024-05-18 MED ORDER — SERTRALINE HCL 25 MG PO TABS
25 mg | ORAL_TABLET | Freq: Every day | ORAL | 3 refills | 30.00000 days | Status: AC
Start: 2024-05-18 — End: ?

## 2024-05-22 ENCOUNTER — Other Ambulatory Visit: Payer: PRIVATE HEALTH INSURANCE

## 2024-05-30 ENCOUNTER — Ambulatory Visit: Payer: PRIVATE HEALTH INSURANCE

## 2024-06-20 ENCOUNTER — Ambulatory Visit: Payer: BLUE CROSS/BLUE SHIELD

## 2024-06-20 DIAGNOSIS — F419 Anxiety disorder, unspecified: Principal | ICD-10-CM

## 2024-06-20 DIAGNOSIS — Z5181 Encounter for therapeutic drug level monitoring: Secondary | ICD-10-CM

## 2024-06-20 DIAGNOSIS — F32A Depression, unspecified depression type: Secondary | ICD-10-CM

## 2024-06-20 NOTE — Progress Notes
 [facility]  Internal Medicine-Pediatrics    PATIENT: Megan Caldwell  MRN: [id number]  DOB: 1996-05-12  DATE OF SERVICE: 06/20/2024    Patient Consent to Telehealth Questionnaire        No data to display              - I agree  to be treated via a video visit and acknowledge that I may be liable for any relevant copays or coinsurance depending on my insurance plan.  - I understand that this video visit is offered for my convenience and I am able to cancel and reschedule for an in-person appointment if I desire.  - I also acknowledge that sensitive medical information may be discussed during this video visit appointment and that it is my responsibility to locate myself in a location that ensures privacy to my own level of comfort.  - I also acknowledge that I should not be participating in a video visit in a way that could cause danger to myself or to those around me (such as driving or walking).  If my provider is concerned about my safety, I understand that they have the right to terminate the visit.     CHIEF COMPLAINT:   Chief Complaint   Patient presents with    Med Management     Called for virtual rooming.  Left message on voice mail.  Apt link sent via text.  CM        HPI   Megan Caldwell is a 28 y.o. female presents for   Chief Complaint   Patient presents with    Med Management     Called for virtual rooming.  Left message on voice mail.  Apt link sent via text.  CM     - started sertraline 25 mg daily a month ago  - was having jaw clenching in the first weeks of use - better but happening every now and then  - some tremors at first  - also noticed decreased appetite at first, but this has also improved   - some nausea at first but no diarrhea  - she thinks it's been ''ok'' in regards to mood - thinks it's helping  - doesn't feel as ''wired''  - has since discontinued buspirone  - not currently on med  - starting leave from work tomorrow for mental health     MEDS Outpatient Medications Prior to Visit   Medication Sig    vibegron (GEMTESA) 75 mg tablet Take 1 tablet (75 mg total) by mouth daily.    ALPRAZolam  0.25 mg tablet Take 1 tablet (0.25 mg total) by mouth daily as needed for Anxiety. Max Daily Amount: 0.25 mg    busPIRone  7.5 MG tablet Take 1 tablet (7.5 mg total) by mouth two (2) times daily.    Cetirizine HCl (ZYRTEC ALLERGY PO) Take by mouth as needed for.    hydrOXYzine 25 mg tablet Take 1 tablet (25 mg total) by mouth at bedtime.    sertraline 25 mg tablet Take 1 tablet (25 mg total) by mouth daily.     No facility-administered medications prior to visit.       PHYSICAL EXAM (limited due to this being a televisit)   (The following were examined and were normal unless noted otherwise)  Gen  [x]  Appearance  [x]  Alert/NAD    Eyes  [x]  Conjunctiva     Resp  [x]  Effort     Psych  []  Mood/Affect improved  LABS/STUDIES   I have:   [x]  Reviewed/ordered []  1 [x]  2 []  >= 3 unique laboratory, radiology, and/or diagnostic tests noted below    []  Reviewed []  1 []  2 []  >= 3 prior external notes and incorporated into patient assessment    []  Discussed management or test interpretation with external provider(s) as noted          10/18/2023    11:00 AM 05/17/2024     5:00 PM 06/20/2024     4:00 PM   GAD-7   Feeling nervous, anxious, or on edge Several days More than half the days Several days   Not being able to stop or control worrying More than half the days More than half the days Several days   Worrying too much about different things Several days Nearly every day Several days   Trouble relaxing Several days More than half the days Several days   Being so restless that is hard to sit still Several days More than half the days Not at all   Becoming easily annoyed or irritable Several days Nearly every day Several days   Feeling afraid, as if something awful might happen Several days More than half the days Several days   Total Score 8 16 6        Total Score Interpretation   >= 10 Possible diagnosis of GAD; confirm by further evaluation   5 Mild anxiety   10 Moderate anxiety   15 Severe anxiety     Most Recent PHQ-9 Score: 7 (06/20/24 1600)    Questionnaire:    Little interest or pleasure in doing things: Several days-Little interest or pleasure in doing things: 1  Feeling down, depressed, or hopeless: Several days-Feeling down, depressed, or hopeless: 1  Trouble falling or staying asleep, or sleeping too much: Not at all-Trouble falling or staying asleep, or sleeping too much: 0  Feeling tired or having little energy: Several days-Feeling tired or having little energy: 1  Poor appetite or overeating: Nearly every day-Poor appetite or overeating: 3  Feeling bad about yourself - or that you are a failure or have let yourself or your family down: Not at all-Feeling bad about yourself - or that you are a failure or have let yourself or your family down: 0  Trouble concentrating on things, such as reading the newspaper or watching television: Several days-Trouble concentrating on things, such as reading the newspaper or watching television: 1  Moving or speaking so slowly that other people could have noticed. Or the opposite - being so fidgety or restless that you have been moving around a lot more than usual: Not at all-Moving or speaking so slowly that other people could have noticed. Or the opposite - being so fidgety or restless that you have been moving around a lot more than usual: 0  Thoughts that you would be better off dead, or of hurting yourself in some way: Not at all-Thoughts that you would be better off dead, or of hurting yourself in some way: 0  Total Score: 7   PHQ-9 Score   Depression Severity     Proposed Treatment Actions    (reference)      0  -  4 None - minimal  None     5  -  9  Mild  Watchful waiting; repeat PHQ-9 at follow-up    10 - 14  Moderate  Treatment plan, considering counseling, follow-up and/or pharmacotherapy    15 - 19  Moderately Severe  Active treatment with pharmacotherapy and/or psychotherapy    20 - 27  Severe  Immediate initiation of pharmacotherapy and, if severe impairment or poor response to therapy, expedited referral to a mental health specialist for psychotherapy and/or collaborative management      A&P   Megan Caldwell is a a 28 y.o. female presenting for the following:    1. Anxiety/Depression/Med Monitoring   - significant improvement of mood on sertraline 25 mg daily  - no changes to dose today  - d/t side effects, will have patient monitor for another 2-3 months and will f/u around that time over video to monitor  - if still present or bothersome, will switch SSRIs     The above recommendation were discussed with the patient.  The patient has all questions answered satisfactorily and is in agreement with this recommended plan of care.    Author:    Bernardino FELIX Nathon Stefanski   06/20/2024 4:25 PM

## 2024-07-25 ENCOUNTER — Other Ambulatory Visit: Payer: BLUE CROSS/BLUE SHIELD

## 2024-08-11 ENCOUNTER — Other Ambulatory Visit: Payer: BLUE CROSS/BLUE SHIELD

## 2024-08-16 ENCOUNTER — Ambulatory Visit: Payer: BLUE CROSS/BLUE SHIELD

## 2024-08-22 ENCOUNTER — Ambulatory Visit: Payer: PRIVATE HEALTH INSURANCE

## 2024-08-22 DIAGNOSIS — F419 Anxiety disorder, unspecified: Principal | ICD-10-CM

## 2024-08-22 DIAGNOSIS — F32A Depression, unspecified depression type: Secondary | ICD-10-CM

## 2024-08-22 DIAGNOSIS — Z5181 Encounter for therapeutic drug level monitoring: Secondary | ICD-10-CM

## 2024-08-22 NOTE — Progress Notes
 [facility]  Internal Medicine-Pediatrics    PATIENT: Megan Caldwell  MRN: [id number]  DOB: July 16, 1996  DATE OF SERVICE: 08/22/2024    Patient Consent to Telehealth Questionnaire        No data to display              - I agree  to be treated via a video visit and acknowledge that I may be liable for any relevant copays or coinsurance depending on my insurance plan.  - I understand that this video visit is offered for my convenience and I am able to cancel and reschedule for an in-person appointment if I desire.  - I also acknowledge that sensitive medical information may be discussed during this video visit appointment and that it is my responsibility to locate myself in a location that ensures privacy to my own level of comfort.  - I also acknowledge that I should not be participating in a video visit in a way that could cause danger to myself or to those around me (such as driving or walking).  If my provider is concerned about my safety, I understand that they have the right to terminate the visit.     CHIEF COMPLAINT:   Chief Complaint   Patient presents with    Med Management     Called for virtual rooming.  No answer, left message on voice mail. Apt link sent via text and email.  CM        HPI   Megan Caldwell is a 29 y.o. female presents for   Chief Complaint   Patient presents with    Med Management     Called for virtual rooming.  No answer, left message on voice mail. Apt link sent via text and email.  CM     - wondering if she should change her SSRI  - noticing effects on her appetite - sometimes increased and sometimes decreased  - past few days noticing she isn't eating as much   - nausea as well at times  - has been on sertraline  for 3 months now  - feels like mood has overall been controlled but still getting irritability     MEDS     Outpatient Medications Prior to Visit   Medication Sig    ALPRAZolam  0.25 mg tablet Take 1 tablet (0.25 mg total) by mouth daily as needed for Anxiety. Max Daily Amount: 0.25 mg    Cetirizine HCl (ZYRTEC ALLERGY PO) Take by mouth as needed for.    hydrOXYzine 25 mg tablet Take 1 tablet (25 mg total) by mouth at bedtime.    sertraline  25 mg tablet Take 1 tablet (25 mg total) by mouth daily.    vibegron (GEMTESA) 75 mg tablet Take 1 tablet (75 mg total) by mouth daily.     No facility-administered medications prior to visit.       PHYSICAL EXAM (limited due to this being a televisit)   (The following were examined and were normal unless noted otherwise)  Gen  [x]  Appearance  [x]  Alert/NAD    Eyes  [x]  Conjunctiva     Resp  [x]  Effort     Psych  []  Mood/Affect         LABS/STUDIES   I have:   [x]  Reviewed/ordered []  1 [x]  2 []  >= 3 unique laboratory, radiology, and/or diagnostic tests noted below    []  Reviewed []  1 []  2 []  >= 3 prior external notes  and incorporated into patient assessment    []  Discussed management or test interpretation with external provider(s) as noted       Most Recent PHQ-9 Score: 6 (08/22/24 1500)    Questionnaire:    Little interest or pleasure in doing things: Several days-Little interest or pleasure in doing things: 1  Feeling down, depressed, or hopeless: Not at all-Feeling down, depressed, or hopeless: 0  Trouble falling or staying asleep, or sleeping too much: Not at all-Trouble falling or staying asleep, or sleeping too much: 0  Feeling tired or having little energy: Several days-Feeling tired or having little energy: 1  Poor appetite or overeating: Nearly every day-Poor appetite or overeating: 3  Feeling bad about yourself - or that you are a failure or have let yourself or your family down: Several days-Feeling bad about yourself - or that you are a failure or have let yourself or your family down: 1  Trouble concentrating on things, such as reading the newspaper or watching television: Not at all-Trouble concentrating on things, such as reading the newspaper or watching television: 0  Moving or speaking so slowly that other people could have noticed. Or the opposite - being so fidgety or restless that you have been moving around a lot more than usual: Not at all-Moving or speaking so slowly that other people could have noticed. Or the opposite - being so fidgety or restless that you have been moving around a lot more than usual: 0  Thoughts that you would be better off dead, or of hurting yourself in some way: Not at all-Thoughts that you would be better off dead, or of hurting yourself in some way: 0  Total Score: 6   PHQ-9 Score   Depression Severity     Proposed Treatment Actions    (reference)      0  -  4 None - minimal  None     5  -  9  Mild  Watchful waiting; repeat PHQ-9 at follow-up    10 - 14  Moderate  Treatment plan, considering counseling, follow-up and/or pharmacotherapy    15 - 19  Moderately Severe  Active treatment with pharmacotherapy and/or psychotherapy    20 - 27  Severe  Immediate initiation of pharmacotherapy and, if severe impairment or poor response to therapy, expedited referral to a mental health specialist for psychotherapy and/or collaborative management          05/17/2024     5:00 PM 06/20/2024     4:00 PM 08/22/2024     3:00 PM   GAD-7   Feeling nervous, anxious, or on edge More than half the days Several days Several days   Not being able to stop or control worrying More than half the days Several days Several days   Worrying too much about different things Nearly every day Several days Several days   Trouble relaxing More than half the days Several days Not at all   Being so restless that is hard to sit still More than half the days Not at all Not at all   Becoming easily annoyed or irritable Nearly every day Several days Nearly every day   Feeling afraid, as if something awful might happen More than half the days Several days Several days   Total Score 16 6 7      Total Score Interpretation   >= 10 Possible diagnosis of GAD; confirm by further evaluation   5 Mild anxiety   10 Moderate anxiety  15 Severe anxiety     A&P   Megan Caldwell is a a 29 y.o. female presenting for the following:    1. Anxiety/Depression/med monitoring  - unclear if the nausea/appetite problems noted in HPI are side effects from the SSRI d/t inconsistent pattern  - will decrease to 12.5 mg daily of sertraline  - pt can cut pill in half  - will see in a few weeks if there is any change to the symptoms and if mood is still well controlled  - if sx persistent, will taper off and try a different SSRI  - if sx go away, will stay at 12.5 mg daily dose provided mood is still controlled    The above recommendation were discussed with the patient.  The patient has all questions answered satisfactorily and is in agreement with this recommended plan of care.    Author:    Bernardino FELIX Diya Gervasi   08/22/2024 3:08 PM
